# Patient Record
Sex: Female | Born: 1940 | Race: White | Hispanic: No | Marital: Married | Smoking: Former smoker
Health system: Southern US, Community
[De-identification: ages and names within clinical notes are randomized; demographics above are authoritative.]

## PROBLEM LIST (undated history)

## (undated) DIAGNOSIS — F32A Depression, unspecified: Secondary | ICD-10-CM

## (undated) DIAGNOSIS — F419 Anxiety disorder, unspecified: Secondary | ICD-10-CM

## (undated) DIAGNOSIS — K5190 Ulcerative colitis, unspecified, without complications: Secondary | ICD-10-CM

## (undated) DIAGNOSIS — E079 Disorder of thyroid, unspecified: Secondary | ICD-10-CM

## (undated) DIAGNOSIS — C4491 Basal cell carcinoma of skin, unspecified: Secondary | ICD-10-CM

## (undated) DIAGNOSIS — E785 Hyperlipidemia, unspecified: Secondary | ICD-10-CM

## (undated) DIAGNOSIS — K635 Polyp of colon: Secondary | ICD-10-CM

## (undated) DIAGNOSIS — D649 Anemia, unspecified: Secondary | ICD-10-CM

## (undated) DIAGNOSIS — K5790 Diverticulosis of intestine, part unspecified, without perforation or abscess without bleeding: Secondary | ICD-10-CM

## (undated) DIAGNOSIS — C439 Malignant melanoma of skin, unspecified: Secondary | ICD-10-CM

## (undated) DIAGNOSIS — K589 Irritable bowel syndrome without diarrhea: Secondary | ICD-10-CM

## (undated) DIAGNOSIS — M1990 Unspecified osteoarthritis, unspecified site: Secondary | ICD-10-CM

## (undated) DIAGNOSIS — G4730 Sleep apnea, unspecified: Secondary | ICD-10-CM

## (undated) DIAGNOSIS — R161 Splenomegaly, not elsewhere classified: Secondary | ICD-10-CM

## (undated) DIAGNOSIS — C9110 Chronic lymphocytic leukemia of B-cell type not having achieved remission: Secondary | ICD-10-CM

## (undated) DIAGNOSIS — J189 Pneumonia, unspecified organism: Secondary | ICD-10-CM

## (undated) HISTORY — DX: Depression, unspecified: F32.A

## (undated) HISTORY — DX: Anemia, unspecified: D64.9

## (undated) HISTORY — DX: Polyp of colon: K63.5

## (undated) HISTORY — DX: Sleep apnea, unspecified: G47.30

## (undated) HISTORY — DX: Disorder of thyroid, unspecified: E07.9

## (undated) HISTORY — DX: Pneumonia, unspecified organism: J18.9

## (undated) HISTORY — DX: Hyperlipidemia, unspecified: E78.5

## (undated) HISTORY — DX: Anxiety disorder, unspecified: F41.9

## (undated) HISTORY — DX: Basal cell carcinoma of skin, unspecified: C44.91

## (undated) HISTORY — DX: Malignant melanoma of skin, unspecified: C43.9

## (undated) HISTORY — DX: Diverticulosis of intestine, part unspecified, without perforation or abscess without bleeding: K57.90

## (undated) HISTORY — DX: Splenomegaly, not elsewhere classified: R16.1

## (undated) HISTORY — DX: Ulcerative colitis, unspecified, without complications: K51.90

## (undated) HISTORY — DX: Chronic lymphocytic leukemia of B-cell type not having achieved remission: C91.10

## (undated) HISTORY — DX: Irritable bowel syndrome, unspecified: K58.9

## (undated) HISTORY — DX: Unspecified osteoarthritis, unspecified site: M19.90

---

## 1973-01-13 HISTORY — PX: NASAL SINUS SURGERY: SHX719

## 1998-10-17 ENCOUNTER — Emergency Department (HOSPITAL_COMMUNITY): Admission: EM | Admit: 1998-10-17 | Discharge: 1998-10-17 | Payer: Self-pay | Admitting: Emergency Medicine

## 2002-12-24 ENCOUNTER — Emergency Department (HOSPITAL_COMMUNITY): Admission: EM | Admit: 2002-12-24 | Discharge: 2002-12-24 | Payer: Self-pay | Admitting: Emergency Medicine

## 2002-12-25 ENCOUNTER — Observation Stay (HOSPITAL_COMMUNITY): Admission: RE | Admit: 2002-12-25 | Discharge: 2002-12-25 | Payer: Self-pay | Admitting: Orthopedic Surgery

## 2003-01-19 ENCOUNTER — Encounter (HOSPITAL_COMMUNITY): Admission: RE | Admit: 2003-01-19 | Discharge: 2003-02-18 | Payer: Self-pay | Admitting: Orthopedic Surgery

## 2003-02-20 ENCOUNTER — Encounter (HOSPITAL_COMMUNITY): Admission: RE | Admit: 2003-02-20 | Discharge: 2003-03-22 | Payer: Self-pay | Admitting: Orthopedic Surgery

## 2003-03-23 ENCOUNTER — Encounter (HOSPITAL_COMMUNITY): Admission: RE | Admit: 2003-03-23 | Discharge: 2003-04-22 | Payer: Self-pay | Admitting: Orthopedic Surgery

## 2003-04-26 ENCOUNTER — Encounter (HOSPITAL_COMMUNITY): Admission: RE | Admit: 2003-04-26 | Discharge: 2003-05-26 | Payer: Self-pay | Admitting: Orthopedic Surgery

## 2005-08-13 ENCOUNTER — Encounter: Admission: RE | Admit: 2005-08-13 | Discharge: 2005-08-13 | Payer: Self-pay | Admitting: Family Medicine

## 2005-08-26 ENCOUNTER — Encounter: Admission: RE | Admit: 2005-08-26 | Discharge: 2005-08-26 | Payer: Self-pay | Admitting: Family Medicine

## 2005-09-02 ENCOUNTER — Ambulatory Visit (HOSPITAL_COMMUNITY): Admission: RE | Admit: 2005-09-02 | Discharge: 2005-09-02 | Payer: Self-pay | Admitting: Otolaryngology

## 2006-09-23 ENCOUNTER — Encounter: Admission: RE | Admit: 2006-09-23 | Discharge: 2006-09-23 | Payer: Self-pay | Admitting: Family Medicine

## 2007-11-02 ENCOUNTER — Encounter: Admission: RE | Admit: 2007-11-02 | Discharge: 2007-11-02 | Payer: Self-pay | Admitting: Family Medicine

## 2007-11-23 ENCOUNTER — Encounter: Admission: RE | Admit: 2007-11-23 | Discharge: 2007-11-23 | Payer: Self-pay | Admitting: Family Medicine

## 2009-04-20 ENCOUNTER — Encounter: Admission: RE | Admit: 2009-04-20 | Discharge: 2009-04-20 | Payer: Self-pay | Admitting: Family Medicine

## 2010-02-03 ENCOUNTER — Encounter: Payer: Self-pay | Admitting: Family Medicine

## 2010-02-04 ENCOUNTER — Encounter: Payer: Self-pay | Admitting: Family Medicine

## 2010-04-16 ENCOUNTER — Other Ambulatory Visit: Payer: Self-pay | Admitting: Family Medicine

## 2010-04-16 DIAGNOSIS — Z1231 Encounter for screening mammogram for malignant neoplasm of breast: Secondary | ICD-10-CM

## 2010-04-23 ENCOUNTER — Other Ambulatory Visit: Payer: Self-pay | Admitting: Family Medicine

## 2010-04-23 DIAGNOSIS — M8580 Other specified disorders of bone density and structure, unspecified site: Secondary | ICD-10-CM

## 2010-04-23 DIAGNOSIS — Z780 Asymptomatic menopausal state: Secondary | ICD-10-CM

## 2010-04-30 ENCOUNTER — Ambulatory Visit
Admission: RE | Admit: 2010-04-30 | Discharge: 2010-04-30 | Disposition: A | Discharge status: Home / Self Care | Payer: Medicare Other | Source: Ambulatory Visit | Attending: Family Medicine | Admitting: Family Medicine

## 2010-04-30 DIAGNOSIS — M8580 Other specified disorders of bone density and structure, unspecified site: Secondary | ICD-10-CM

## 2010-04-30 DIAGNOSIS — Z1231 Encounter for screening mammogram for malignant neoplasm of breast: Secondary | ICD-10-CM

## 2010-04-30 DIAGNOSIS — Z780 Asymptomatic menopausal state: Secondary | ICD-10-CM

## 2010-05-31 NOTE — Op Note (Signed)
NAME:  Belinda Montgomery, Belinda Montgomery NO.:  [id number]   MEDICAL RECORD NO.:  [id number]                   PATIENT TYPE:  OBV   LOCATION:  A309                                 FACILITY:  [facility]   PHYSICIAN:  Vickki Hearing, M.D.           DATE OF BIRTH:  February 13, 1940   DATE OF PROCEDURE:  12/25/2002  DATE OF DISCHARGE:                                 OPERATIVE REPORT   PREOPERATIVE DIAGNOSIS:  Fracture dislocation, right shoulder.   POSTOPERATIVE DIAGNOSIS:  Fracture dislocation, right shoulder.   PROCEDURE:  Closed reduction under anesthesia, right shoulder fracture  dislocation.   SURGEON:  Vickki Hearing, M.D.   ANESTHETIC:  IV sedation.   OPERATIVE FINDINGS:  Greater tuberosity fracture, anterior-inferior  dislocation of the right glenohumeral joint.  After reduction using  countertraction and traction, radiograph in the OR and fluoroscopic  radiographs confirmed reduction.   PROCEDURE IN DETAIL:  The patient was identified as Belinda Montgomery.  She  was taken to the operating room where a general anesthetic was administered.  We took time out and checked her chart and consent and armband and confirmed  the procedure.   PROCEDURE:  With traction/countertraction method and reduced the shoulder.  Took a C-arm x-ray and fluoroscopic views to confirm the reduction, reversed  the anesthesia, took her back to the room for recovery.   She will follow up on the 20th for evaluation.  She should be in a shoulder  sling and swathe.  Percocet, Norflex, and ibuprofen for pain, muscle  relaxation, and swelling.      ___________________________________________                                            Vickki Hearing, M.D.   SEH/MEDQ  D:  12/25/2002  T:  12/25/2002  Job:  [id number]

## 2011-01-20 DIAGNOSIS — Z85828 Personal history of other malignant neoplasm of skin: Secondary | ICD-10-CM | POA: Insufficient documentation

## 2011-06-30 ENCOUNTER — Other Ambulatory Visit (HOSPITAL_COMMUNITY): Payer: Self-pay | Admitting: Family Medicine

## 2011-06-30 DIAGNOSIS — Z139 Encounter for screening, unspecified: Secondary | ICD-10-CM

## 2011-07-21 ENCOUNTER — Ambulatory Visit (HOSPITAL_COMMUNITY)
Admission: RE | Admit: 2011-07-21 | Discharge: 2011-07-21 | Disposition: A | Discharge status: Home / Self Care | Payer: Medicare Other | Source: Ambulatory Visit | Attending: Family Medicine | Admitting: Family Medicine

## 2011-07-21 DIAGNOSIS — Z1231 Encounter for screening mammogram for malignant neoplasm of breast: Secondary | ICD-10-CM | POA: Insufficient documentation

## 2011-07-21 DIAGNOSIS — Z139 Encounter for screening, unspecified: Secondary | ICD-10-CM

## 2012-06-30 ENCOUNTER — Other Ambulatory Visit: Payer: Self-pay | Admitting: Family Medicine

## 2012-07-01 ENCOUNTER — Other Ambulatory Visit: Payer: Self-pay | Admitting: Family Medicine

## 2012-07-01 DIAGNOSIS — Z1231 Encounter for screening mammogram for malignant neoplasm of breast: Secondary | ICD-10-CM

## 2012-07-01 DIAGNOSIS — M8580 Other specified disorders of bone density and structure, unspecified site: Secondary | ICD-10-CM

## 2012-07-15 DIAGNOSIS — D72820 Lymphocytosis (symptomatic): Secondary | ICD-10-CM | POA: Insufficient documentation

## 2012-07-21 ENCOUNTER — Other Ambulatory Visit: Payer: Medicare Other

## 2012-07-21 ENCOUNTER — Ambulatory Visit: Payer: Medicare Other

## 2012-08-03 ENCOUNTER — Encounter: Payer: Self-pay | Admitting: Gastroenterology

## 2012-08-11 ENCOUNTER — Ambulatory Visit
Admission: RE | Admit: 2012-08-11 | Discharge: 2012-08-11 | Disposition: A | Discharge status: Home / Self Care | Payer: Medicare Other | Source: Ambulatory Visit | Attending: Family Medicine | Admitting: Family Medicine

## 2012-08-11 DIAGNOSIS — Z1231 Encounter for screening mammogram for malignant neoplasm of breast: Secondary | ICD-10-CM

## 2012-08-11 DIAGNOSIS — M8580 Other specified disorders of bone density and structure, unspecified site: Secondary | ICD-10-CM

## 2013-07-06 ENCOUNTER — Other Ambulatory Visit: Payer: Self-pay

## 2013-07-06 DIAGNOSIS — Z1231 Encounter for screening mammogram for malignant neoplasm of breast: Secondary | ICD-10-CM

## 2013-08-15 ENCOUNTER — Ambulatory Visit
Admission: RE | Admit: 2013-08-15 | Discharge: 2013-08-15 | Disposition: A | Discharge status: Home / Self Care | Payer: Medicare Other | Source: Ambulatory Visit

## 2013-08-15 DIAGNOSIS — Z1231 Encounter for screening mammogram for malignant neoplasm of breast: Secondary | ICD-10-CM

## 2014-01-13 HISTORY — PX: LUMBAR LAMINECTOMY: SHX95

## 2014-03-06 DIAGNOSIS — Z85820 Personal history of malignant melanoma of skin: Secondary | ICD-10-CM | POA: Insufficient documentation

## 2014-03-28 DIAGNOSIS — M5126 Other intervertebral disc displacement, lumbar region: Secondary | ICD-10-CM | POA: Insufficient documentation

## 2015-04-25 ENCOUNTER — Other Ambulatory Visit: Payer: Self-pay | Admitting: Internal Medicine

## 2015-04-25 DIAGNOSIS — M8580 Other specified disorders of bone density and structure, unspecified site: Secondary | ICD-10-CM

## 2015-04-26 ENCOUNTER — Other Ambulatory Visit: Payer: Self-pay

## 2015-04-26 DIAGNOSIS — Z1231 Encounter for screening mammogram for malignant neoplasm of breast: Secondary | ICD-10-CM

## 2015-05-25 ENCOUNTER — Ambulatory Visit
Admission: RE | Admit: 2015-05-25 | Discharge: 2015-05-25 | Disposition: A | Discharge status: Home / Self Care | Payer: Medicare Other | Source: Ambulatory Visit

## 2015-05-25 ENCOUNTER — Ambulatory Visit
Admission: RE | Admit: 2015-05-25 | Discharge: 2015-05-25 | Disposition: A | Discharge status: Home / Self Care | Payer: Medicare Other | Source: Ambulatory Visit | Attending: Internal Medicine | Admitting: Internal Medicine

## 2015-05-25 DIAGNOSIS — M8580 Other specified disorders of bone density and structure, unspecified site: Secondary | ICD-10-CM

## 2015-05-25 DIAGNOSIS — Z1231 Encounter for screening mammogram for malignant neoplasm of breast: Secondary | ICD-10-CM

## 2016-02-14 DIAGNOSIS — M533 Sacrococcygeal disorders, not elsewhere classified: Secondary | ICD-10-CM | POA: Insufficient documentation

## 2016-07-29 DIAGNOSIS — E039 Hypothyroidism, unspecified: Secondary | ICD-10-CM | POA: Insufficient documentation

## 2016-12-09 NOTE — Progress Notes (Signed)
[name] [address], [place] [address] Phone: [phone] Subjective:    I'm seeing this patient by the request  of:    CC: right knee pain   [id number]  Belinda Montgomery is a 76 y.o. female coming in with complaint of right knee pain. She states that she gets swelling in the inside of her knee.  She has osteoarthritis (hips and lower back). She's had a L3/L4 disc surgery almost 2 years ago.  Onset- Chronic Location- medial and patella  Duration- Throughout the day and during the night Character- Sharp. Mostly "level 3 pain" Aggravating factors- Stairs, kneeling  Reliving factors- topical  Therapies tried-  Severity-6 out of 10      History reviewed. No pertinent past medical history. History reviewed. No pertinent surgical history. Social History   Socioeconomic History  . Marital status: Divorced    Spouse name: None  . Number of children: None  . Years of education: None  . Highest education level: None  Social Needs  . Financial resource strain: None  . Food insecurity - worry: None  . Food insecurity - inability: None  . Transportation needs - medical: None  . Transportation needs - non-medical: None  Occupational History  . None  Tobacco Use  . Smoking status: Former Research scientist (life sciences)  . Smokeless tobacco: Never Used  Substance and Sexual Activity  . Alcohol use: None  . Drug use: None  . Sexual activity: None  Other Topics Concern  . None  Social History Narrative  . None   No Known Allergies History reviewed. No pertinent family history.  No family history of autoimmune disease   Past medical history, social, surgical and family history all reviewed in electronic medical record.  No pertanent information unless stated regarding to the chief complaint.   Review of Systems:Review of systems updated and as accurate as of 12/10/16  No headache, visual changes, nausea, vomiting, diarrhea, constipation, dizziness,  abdominal pain, skin rash, fevers, chills, night sweats, weight loss, swollen lymph nodes, body aches, joint swelling, muscle aches, chest pain, shortness of breath, mood changes.   Objective  Blood pressure (!) 144/70, pulse 70, height 5\' 6"  (1.676 m), weight 158 lb (71.7 kg), SpO2 98 %. Systems examined below as of 12/10/16   General: No apparent distress alert and oriented x3 mood and affect normal, dressed appropriately.  HEENT: Pupils equal, extraocular movements intact  Respiratory: Patient's speak in full sentences and does not appear short of breath  Cardiovascular: No lower extremity edema, non tender, no erythema  Skin: Warm dry intact with no signs of infection or rash on extremities or on axial skeleton.  Abdomen: Soft nontender  Neuro: Cranial nerves II through XII are intact, neurovascularly intact in all extremities with 2+ DTRs and 2+ pulses.  Lymph: No lymphadenopathy of posterior or anterior cervical chain or axillae bilaterally.  Gait normal with good balance and coordination.  MSK:  Non tender with full range of motion and good stability and symmetric strength and tone of shoulders, elbows, wrist, hip, and ankles bilaterally.  Mild arthritic changes of multiple joints  Right knee exam shows the patient does have some mild osteoarthritic changes.  Ligaments seem to be stable.  Mild crepitus with range of motion.  Patient does have a positive McMurray's.  Pain over the medial joint line.  Limited muscular skeletal ultrasound was performed and interpreted by [name]  Limited musculoskeletal ultrasound showed the patient does have a  degenerative meniscal tear with very mild displacement on the medial posterior aspect. Impression: Degenerative meniscal tear  97110; 15 additional minutes spent for Therapeutic exercises as stated in above notes.  This included exercises focusing on stretching, strengthening, with significant focus on eccentric aspects.   Long term goals  include an improvement in range of motion, strength, endurance as well as avoiding reinjury. Patient's frequency would include in 1-2 times a day, 3-5 times a week for a duration of 6-12 weeks.   Flexion-extension exercises working on eccentric to the hamstring and quadricep.  Proper technique shown and discussed handout in great detail with ATC.  All questions were discussed and answered.     Impression and Recommendations:     This case required medical decision making of moderate complexity.      Note: This dictation was prepared with Dragon dictation along with smaller phrase technology. Any transcriptional errors that result from this process are unintentional.

## 2016-12-10 ENCOUNTER — Encounter: Payer: Self-pay | Admitting: Family Medicine

## 2016-12-10 ENCOUNTER — Ambulatory Visit: Payer: Self-pay

## 2016-12-10 ENCOUNTER — Ambulatory Visit (INDEPENDENT_AMBULATORY_CARE_PROVIDER_SITE_OTHER): Payer: Medicare Other | Admitting: Family Medicine

## 2016-12-10 VITALS — BP 144/70 | HR 70 | Ht 66.0 in | Wt 158.0 lb

## 2016-12-10 DIAGNOSIS — S83221A Peripheral tear of medial meniscus, current injury, right knee, initial encounter: Secondary | ICD-10-CM | POA: Diagnosis not present

## 2016-12-10 DIAGNOSIS — G8929 Other chronic pain: Secondary | ICD-10-CM

## 2016-12-10 DIAGNOSIS — S83206A Unspecified tear of unspecified meniscus, current injury, right knee, initial encounter: Secondary | ICD-10-CM | POA: Insufficient documentation

## 2016-12-10 DIAGNOSIS — M25561 Pain in right knee: Secondary | ICD-10-CM | POA: Diagnosis not present

## 2016-12-10 MED ORDER — VITAMIN D (ERGOCALCIFEROL) 1.25 MG (50000 UNIT) PO CAPS: 50000.0000 [IU] | ORAL_CAPSULE | ORAL | 0 refills | Status: DC

## 2016-12-10 NOTE — Assessment & Plan Note (Signed)
Patient does have a meniscal tear that seems to be degenerative.  We discussed with patient at great length.  Patient will try once weekly vitamin D for muscle strength and endurance, home exercise, icing regimen, topical anti-inflammatories.  Work with Product/process development scientist to learn home exercise greater detail.  Follow-up again in 4 weeks if no better consider formal physical therapy and injection.

## 2016-12-10 NOTE — Patient Instructions (Addendum)
Nice to meet you  Ice 20 minutes 2 times daily. Usually after activity and before bed. Exercises 3 times a week.  pennsaid pinkie amount topically 2 times daily as needed.  Once weekly vitamin D for 12 weeks  Avoid squatting and twisting.  Turmeric 500mg  daily  Possible a squatting stool for gardening.  See me again in 4 weeks

## 2017-01-07 NOTE — Progress Notes (Signed)
[name] [address], [place] [address] Phone: [phone] Subjective:    I'm seeing this patient by the request  of:    CC: Follow-up  [id number]  Belinda Montgomery is a 76 y.o. female coming in with complaint of right knee pain.  Found to have a meniscal tear with some mild osteoarthritic changes.  Given home exercises, icing regimen, we discussed over-the-counter braces and given trial of topical anti-inflammatories.  Patient states no improvement.  Has been somewhat noncompliant with the conservative therapy.     No past medical history on file. No past surgical history on file. Social History   Socioeconomic History  . Marital status: Divorced    Spouse name: Not on file  . Number of children: Not on file  . Years of education: Not on file  . Highest education level: Not on file  Social Needs  . Financial resource strain: Not on file  . Food insecurity - worry: Not on file  . Food insecurity - inability: Not on file  . Transportation needs - medical: Not on file  . Transportation needs - non-medical: Not on file  Occupational History  . Not on file  Tobacco Use  . Smoking status: Former Research scientist (life sciences)  . Smokeless tobacco: Never Used  Substance and Sexual Activity  . Alcohol use: Not on file  . Drug use: Not on file  . Sexual activity: Not on file  Other Topics Concern  . Not on file  Social History Narrative  . Not on file   No Known Allergies No family history on file.   Past medical history, social, surgical and family history all reviewed in electronic medical record.  No pertanent information unless stated regarding to the chief complaint.   Review of Systems:Review of systems updated and as accurate as of 01/07/17  No headache, visual changes, nausea, vomiting, diarrhea, constipation, dizziness, abdominal pain, skin rash, fevers, chills, night sweats, weight loss, swollen lymph nodes, body aches, joint swelling,  chest pain, shortness of breath, mood changes.  Positive muscle aches  Objective  There were no vitals taken for this visit. Systems examined below as of 01/07/17   General: No apparent distress alert and oriented x3 mood and affect normal, dressed appropriately.  HEENT: Pupils equal, extraocular movements intact  Respiratory: Patient's speak in full sentences and does not appear short of breath  Cardiovascular: No lower extremity edema, non tender, no erythema  Skin: Warm dry intact with no signs of infection or rash on extremities or on axial skeleton.  Abdomen: Soft nontender  Neuro: Cranial nerves II through XII are intact, neurovascularly intact in all extremities with 2+ DTRs and 2+ pulses.  Lymph: No lymphadenopathy of posterior or anterior cervical chain or axillae bilaterally.  Gait normal with good balance and coordination.  MSK:  Non tender with full range of motion and good stability and symmetric strength and tone of shoulders, elbows, wrist, hip and ankles bilaterally.   Knee: Right Normal to inspection with no erythema or effusion or obvious bony abnormalities. Palpation over the medial joint line ROM full in flexion and extension and lower leg rotation. Ligaments with solid consistent endpoints including ACL, PCL, LCL, MCL. Positive Mcmurray's, Apley's, and Thessalonian tests. Non painful patellar compression. Patellar glide with moderate crepitus. Patellar and quadriceps tendons unremarkable. Hamstring and quadriceps strength is normal. Contralateral knee unremarkable  After informed written and verbal consent, patient was seated on exam table. Right knee was prepped with  alcohol swab and utilizing anterolateral approach, patient's right knee space was injected with 4:1  marcaine 0.5%: Kenalog 40mg /dL. Patient tolerated the procedure well without immediate complications.   Impression and Recommendations:     This case required medical decision making of moderate  complexity.      Note: This dictation was prepared with Dragon dictation along with smaller phrase technology. Any transcriptional errors that result from this process are unintentional.

## 2017-01-08 ENCOUNTER — Encounter: Payer: Self-pay | Admitting: Family Medicine

## 2017-01-08 ENCOUNTER — Ambulatory Visit (INDEPENDENT_AMBULATORY_CARE_PROVIDER_SITE_OTHER): Payer: Medicare Other | Admitting: Family Medicine

## 2017-01-08 VITALS — BP 120/62 | HR 75 | Ht 66.0 in | Wt 162.0 lb

## 2017-01-08 DIAGNOSIS — M25561 Pain in right knee: Secondary | ICD-10-CM

## 2017-01-08 DIAGNOSIS — S83221D Peripheral tear of medial meniscus, current injury, right knee, subsequent encounter: Secondary | ICD-10-CM

## 2017-01-08 NOTE — Assessment & Plan Note (Signed)
Worsening symptoms.  Given injection today.  Sent to formal physical therapy.  Continue to once weekly vitamin D, icing regimen.  May need further imaging if this continues.  Patient wants to avoid any surgical intervention at this time so I do not feel that this will change management.

## 2017-01-08 NOTE — Patient Instructions (Signed)
Good to see you  Belinda Montgomery is your friend.  Injected the knee today to calm it down as well.  Physical therapy will be calling you  Continue my exercises 2-3 times a week  See me again in 4 weeks

## 2017-01-27 ENCOUNTER — Telehealth: Payer: Self-pay | Admitting: Family Medicine

## 2017-01-27 NOTE — Telephone Encounter (Signed)
Copied from [place]. Topic: Quick Communication - See Telephone Encounter >> Jan 27, 2017  4:39 PM Bea Graff, NT wrote: CRM for notification. See Telephone encounter for: Pt would like a call from Dr. Tamala Julian regarding her right knee pain. She thinks she may have re-injured her knee.  01/27/17.

## 2017-01-30 ENCOUNTER — Ambulatory Visit (INDEPENDENT_AMBULATORY_CARE_PROVIDER_SITE_OTHER): Payer: Medicare Other | Admitting: Family Medicine

## 2017-01-30 ENCOUNTER — Encounter: Payer: Self-pay | Admitting: Family Medicine

## 2017-01-30 VITALS — BP 140/70 | HR 68 | Ht 66.0 in | Wt 162.0 lb

## 2017-01-30 DIAGNOSIS — M25561 Pain in right knee: Secondary | ICD-10-CM

## 2017-01-30 DIAGNOSIS — G8929 Other chronic pain: Secondary | ICD-10-CM

## 2017-01-30 DIAGNOSIS — S83221D Peripheral tear of medial meniscus, current injury, right knee, subsequent encounter: Secondary | ICD-10-CM

## 2017-01-30 MED ORDER — PREDNISONE 50 MG PO TABS: 50.0000 mg | ORAL_TABLET | Freq: Every day | ORAL | 0 refills | Status: DC

## 2017-01-30 NOTE — Assessment & Plan Note (Signed)
Worsening symptoms.  Patient had a meniscal tear and seemed to have increasing discomfort.  Patient is having even difficulty with weightbearing at this time.  Has failed all other conservative therapy.  Patient will have an MRI to discuss the possibility of either Visco supplementation for underlying arthritis or potentially surgical intervention.  On MRI we will discuss further treatment options.

## 2017-01-30 NOTE — Patient Instructions (Signed)
Good to see you  Belinda Montgomery is your friend.  Possible compression sleeve to knee as well  We will get MRI  Depending on this we may need surgery and will refer accordingly Call [phone] to schedule the MRI  We will discuss after the scan

## 2017-01-30 NOTE — Progress Notes (Signed)
  [name] [address] [place], [place] [address] Phone: [phone] Subjective:     CC: Right knee pain  [id number]  Belinda Montgomery is a 77 y.o. female coming in with complaint of right knee pain. [facility] would not take insurance. She was cleaning when she twisted her knee.  Since then she feels like there is more swelling.  Locking.  Having very significant difficulty even with weight bearing.      No past medical history on file. No past surgical history on file. Social History   Socioeconomic History  . Marital status: Divorced    Spouse name: None  . Number of children: None  . Years of education: None  . Highest education level: None  Social Needs  . Financial resource strain: None  . Food insecurity - worry: None  . Food insecurity - inability: None  . Transportation needs - medical: None  . Transportation needs - non-medical: None  Occupational History  . None  Tobacco Use  . Smoking status: Former Research scientist (life sciences)  . Smokeless tobacco: Never Used  Substance and Sexual Activity  . Alcohol use: None  . Drug use: None  . Sexual activity: None  Other Topics Concern  . None  Social History Narrative  . None   No Known Allergies No family history on file.   Past medical history, social, surgical and family history all reviewed in electronic medical record.  No pertanent information unless stated regarding to the chief complaint.   Review of Systems:Review of systems updated and as accurate as of 01/30/17  No headache, visual changes, nausea, vomiting, diarrhea, constipation, dizziness, abdominal pain, skin rash, fevers, chills, night sweats, weight loss, swollen lymph nodes, body aches chest pain, shortness of breath, mood changes.  Muscle aches and joint effusion  Objective  Blood pressure 140/70, pulse 68, height 5\' 6"  (1.676 m), weight 162 lb (73.5 kg), SpO2 97 %. Systems examined below as of 01/30/17   General: No  apparent distress alert and oriented x3 mood and affect normal, dressed appropriately.  HEENT: Pupils equal, extraocular movements intact  Respiratory: Patient's speak in full sentences and does not appear short of breath  Cardiovascular: No lower extremity edema, non tender, no erythema  Skin: Warm dry intact with no signs of infection or rash on extremities or on axial skeleton.  Abdomen: Soft nontender  Neuro: Cranial nerves II through XII are intact, neurovascularly intact in all extremities with 2+ DTRs and 2+ pulses.  Lymph: No lymphadenopathy of posterior or anterior cervical chain or axillae bilaterally.  Gait antalgic MSK:  Non tender with full range of motion and good stability and symmetric strength and tone of shoulders, elbows, wrist, hip, and ankles bilaterally.  Right knee exam shows that there is an effusion.  Left degrees of flexion.  Positive McMurray's.  Crepitus noted.  Severe tenderness to palpation over the medial joint line.   Impression and Recommendations:     This case required medical decision making of moderate complexity.      Note: This dictation was prepared with Dragon dictation along with smaller phrase technology. Any transcriptional errors that result from this process are unintentional.

## 2017-01-31 ENCOUNTER — Ambulatory Visit
Admission: RE | Admit: 2017-01-31 | Discharge: 2017-01-31 | Disposition: A | Discharge status: Home / Self Care | Payer: Medicare Other | Source: Ambulatory Visit | Attending: Family Medicine | Admitting: Family Medicine

## 2017-01-31 DIAGNOSIS — M25561 Pain in right knee: Principal | ICD-10-CM

## 2017-01-31 DIAGNOSIS — G8929 Other chronic pain: Secondary | ICD-10-CM

## 2017-02-02 ENCOUNTER — Telehealth: Payer: Self-pay | Admitting: General Practice

## 2017-02-02 NOTE — Progress Notes (Signed)
Spoke with patient per results. She would like to come in and speak with Dr. Tamala Julian about options.

## 2017-02-02 NOTE — Telephone Encounter (Signed)
The patient called stating she was returning a phone call from [place]. The patient states she has Medicare and Medicaid, and has some questions concerning them.   The patient would like to know what combinations of medicare and medicare work for our office. I advised the patient on what I knew: that medicaid is only accepted when the patient has already been established in our office, for family planning purposes, or for medicaid as a secondary insurance.  The patient would like to have "outpatient therapy" explained to her. She would like to know what it is, where to go to set it up, and how insurance would work for it.   The patient would like to know, if she's seen in our office, what her copay would be if we just billed her medicare.   Please call patient back to advise.

## 2017-02-03 NOTE — Telephone Encounter (Signed)
I have called the patient and spoke to her about our office not being able to accept Medicaid as a form of insurance primarily or secondary. I suggested that the patient contact the referring provider and they can discuss other potential offices that do accept her insurance. Patient stated that she understood and will reach out to the referring provider. No further action required by our office.

## 2017-02-04 NOTE — Progress Notes (Deleted)
  [name] [address] [place], [place] [address] Phone: [phone] Subjective:    I'm seeing this patient by the request  of:    CC: Knee pain follow-up  [id number]  Belinda Montgomery is a 77 y.o. female coming in with complaint of knee pain.  Patient was having worsening symptoms.  Patient was sent for an MRI.  MRI was independently visualized showing the patient did have a degenerative tear posterior horn of the medial meniscus.  This is what we saw previously.  There was a flap tear noted that likely contributed to the locking the patient was having.  Patient was given the choices of formal physical therapy or possibly surgical intervention.  Patient states     No past medical history on file. No past surgical history on file. Social History   Socioeconomic History  . Marital status: Divorced    Spouse name: Not on file  . Number of children: Not on file  . Years of education: Not on file  . Highest education level: Not on file  Social Needs  . Financial resource strain: Not on file  . Food insecurity - worry: Not on file  . Food insecurity - inability: Not on file  . Transportation needs - medical: Not on file  . Transportation needs - non-medical: Not on file  Occupational History  . Not on file  Tobacco Use  . Smoking status: Former Research scientist (life sciences)  . Smokeless tobacco: Never Used  Substance and Sexual Activity  . Alcohol use: Not on file  . Drug use: Not on file  . Sexual activity: Not on file  Other Topics Concern  . Not on file  Social History Narrative  . Not on file   No Known Allergies No family history on file.   Past medical history, social, surgical and family history all reviewed in electronic medical record.  No pertanent information unless stated regarding to the chief complaint.   Review of Systems:Review of systems updated and as accurate as of 02/04/17  No headache, visual changes, nausea, vomiting, diarrhea,  constipation, dizziness, abdominal pain, skin rash, fevers, chills, night sweats, weight loss, swollen lymph nodes, body aches, joint swelling, muscle aches, chest pain, shortness of breath, mood changes.   Objective  There were no vitals taken for this visit. Systems examined below as of 02/04/17   General: No apparent distress alert and oriented x3 mood and affect normal, dressed appropriately.  HEENT: Pupils equal, extraocular movements intact  Respiratory: Patient's speak in full sentences and does not appear short of breath  Cardiovascular: No lower extremity edema, non tender, no erythema  Skin: Warm dry intact with no signs of infection or rash on extremities or on axial skeleton.  Abdomen: Soft nontender  Neuro: Cranial nerves II through XII are intact, neurovascularly intact in all extremities with 2+ DTRs and 2+ pulses.  Lymph: No lymphadenopathy of posterior or anterior cervical chain or axillae bilaterally.  Gait normal with good balance and coordination.  MSK:  Non tender with full range of motion and good stability and symmetric strength and tone of shoulders, elbows, wrist, hip, knee and ankles bilaterally.     Impression and Recommendations:     This case required medical decision making of moderate complexity.      Note: This dictation was prepared with Dragon dictation along with smaller phrase technology. Any transcriptional errors that result from this process are unintentional.

## 2017-02-05 ENCOUNTER — Ambulatory Visit: Payer: Medicare Other | Admitting: Family Medicine

## 2017-02-15 NOTE — Progress Notes (Signed)
[name] [place], [place] [address] Phone: [phone] Subjective:    I'm seeing this patient by the request  of:    CC: Right knee pain follow-up  UMP:[id number]  Belinda Montgomery is a 77 y.o. female coming in with complaint of right knee pain.  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  Was not improving with conservative therapy.  Unable to go to formal physical therapy due to insurance difficulties. Patient's MRI did confirm the degenerative tear of the posterior horn of the medial meniscus.  This was independently visualized by me.  Mild arthritic changes otherwise.  Patient is having locking.  Patient states that it feels like it is giving out.  Been using a cane.  Patient feels very unsteady on the knee.  Cannot flex it or extend it all the way.  Still having severe pain.  Failed all conservative therapy.      No past medical history on file. No past surgical history on file. Social History   Socioeconomic History  . Marital status: Divorced    Spouse name: None  . Number of children: None  . Years of education: None  . Highest education level: None  Social Needs  . Financial resource strain: None  . Food insecurity - worry: None  . Food insecurity - inability: None  . Transportation needs - medical: None  . Transportation needs - non-medical: None  Occupational History  . None  Tobacco Use  . Smoking status: Former Research scientist (life sciences)  . Smokeless tobacco: Never Used  Substance and Sexual Activity  . Alcohol use: None  . Drug use: None  . Sexual activity: None  Other Topics Concern  . None  Social History Narrative  . None   No Known Allergies No family history on file.   Past medical history, social, surgical and family history all reviewed in electronic medical record.  No pertanent information unless stated regarding to the chief complaint.   Review of Systems:Review of systems updated and as  accurate as of 02/16/17  No headache, visual changes, nausea, vomiting, diarrhea, constipation, dizziness, abdominal pain, skin rash, fevers, chills, night sweats, weight loss, swollen lymph nodes, body aches, joint swelling, muscle aches, chest pain, shortness of breath, mood changes.   Objective  Blood pressure (!) 142/64, pulse 80, height 5\' 6"  (1.676 m), weight 162 lb (73.5 kg), SpO2 97 %. Systems examined below as of 02/16/17   General: No apparent distress alert and oriented x3 mood and affect normal, dressed appropriately.  HEENT: Pupils equal, extraocular movements intact  Respiratory: Patient's speak in full sentences and does not appear short of breath  Cardiovascular: No lower extremity edema, non tender, no erythema  Skin: Warm dry intact with no signs of infection or rash on extremities or on axial skeleton.  Abdomen: Soft nontender  Neuro: Cranial nerves II through XII are intact, neurovascularly intact in all extremities with 2+ DTRs and 2+ pulses.  Lymph: No lymphadenopathy of posterior or anterior cervical chain or axillae bilaterally.  Gait severely antalgic MSK:  Non tender with full range of motion and good stability and symmetric strength and tone of shoulders, elbows, wrist, hip and ankles bilaterally.  Knee: Right knee Normal to inspection with no erythema or effusion or obvious bony abnormalities. Tender over the medial joint line ROM lacks full extension by 5 degrees and lacks full flexion by 10 degrees Ligaments with solid consistent endpoints including ACL, PCL, LCL, MCL.  Positive Mcmurray's, Apley's, and Thessalonian tests. painful patellar compression. Patellar glide moderate crepitus. Patellar and quadriceps tendons unremarkable. Hamstring and quadriceps strength is normal. Contralateral knee unremarkable      Impression and Recommendations:     This case required medical decision making of moderate complexity.      Note: This dictation was  prepared with Dragon dictation along with smaller phrase technology. Any transcriptional errors that result from this process are unintentional.

## 2017-02-16 ENCOUNTER — Encounter: Payer: Self-pay | Admitting: Family Medicine

## 2017-02-16 ENCOUNTER — Ambulatory Visit (INDEPENDENT_AMBULATORY_CARE_PROVIDER_SITE_OTHER): Payer: Medicare Other | Admitting: Family Medicine

## 2017-02-16 VITALS — BP 142/64 | HR 80 | Ht 66.0 in | Wt 162.0 lb

## 2017-02-16 DIAGNOSIS — S83221D Peripheral tear of medial meniscus, current injury, right knee, subsequent encounter: Secondary | ICD-10-CM | POA: Diagnosis not present

## 2017-02-16 DIAGNOSIS — M25561 Pain in right knee: Secondary | ICD-10-CM | POA: Diagnosis not present

## 2017-02-16 NOTE — Assessment & Plan Note (Signed)
Spent  25 minutes with patient face-to-face and had greater than 50% of counseling including as described in assessment and plan.  Discussed with patient likely she does have a degenerative tear.  Patient has failed all conservative therapy with a locking aspect of patient would like some type of potential surgical interventions the patient would be able to get back to her work as well.  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  He is even having difficulty with activities of daily living.  Will be referred to the orthopedic surgeon of her choice at this point.  Had questions about billing aspect for the surgery and discussed that this would be better asked and answered to the orthopedic surgeon.

## 2017-02-16 NOTE — Patient Instructions (Signed)
Good to see you  Belinda Montgomery is your friend.  We will get you to Dr. Hassell Done at [facility] and see what we can do about the meniscus.  Sorry for the round about way here to get it done.

## 2017-03-05 ENCOUNTER — Encounter: Payer: Self-pay | Admitting: Family Medicine

## 2017-05-18 ENCOUNTER — Other Ambulatory Visit: Payer: Self-pay | Admitting: Internal Medicine

## 2017-05-18 DIAGNOSIS — Z1231 Encounter for screening mammogram for malignant neoplasm of breast: Secondary | ICD-10-CM

## 2017-05-18 DIAGNOSIS — E2839 Other primary ovarian failure: Secondary | ICD-10-CM

## 2017-06-26 ENCOUNTER — Ambulatory Visit: Payer: Medicare Other

## 2017-06-26 ENCOUNTER — Other Ambulatory Visit: Payer: Medicare Other

## 2017-08-05 ENCOUNTER — Ambulatory Visit
Admission: RE | Admit: 2017-08-05 | Discharge: 2017-08-05 | Disposition: A | Discharge status: Home / Self Care | Payer: Medicare Other | Source: Ambulatory Visit | Attending: Internal Medicine | Admitting: Internal Medicine

## 2017-08-05 DIAGNOSIS — E2839 Other primary ovarian failure: Secondary | ICD-10-CM

## 2017-08-05 DIAGNOSIS — Z1231 Encounter for screening mammogram for malignant neoplasm of breast: Secondary | ICD-10-CM

## 2018-02-05 ENCOUNTER — Other Ambulatory Visit: Payer: Self-pay | Admitting: Orthopedic Surgery

## 2018-02-05 DIAGNOSIS — M79661 Pain in right lower leg: Secondary | ICD-10-CM

## 2018-02-05 DIAGNOSIS — R609 Edema, unspecified: Secondary | ICD-10-CM

## 2018-02-08 ENCOUNTER — Ambulatory Visit
Admission: RE | Admit: 2018-02-08 | Discharge: 2018-02-08 | Disposition: A | Discharge status: Home / Self Care | Payer: Medicare Other | Source: Ambulatory Visit | Attending: Orthopedic Surgery | Admitting: Orthopedic Surgery

## 2018-02-08 DIAGNOSIS — M79661 Pain in right lower leg: Secondary | ICD-10-CM

## 2018-02-08 DIAGNOSIS — R609 Edema, unspecified: Secondary | ICD-10-CM

## 2018-07-08 DIAGNOSIS — E7800 Pure hypercholesterolemia, unspecified: Secondary | ICD-10-CM | POA: Insufficient documentation

## 2018-08-03 DIAGNOSIS — D509 Iron deficiency anemia, unspecified: Secondary | ICD-10-CM | POA: Insufficient documentation

## 2018-09-07 LAB — HM COLONOSCOPY

## 2018-12-07 DIAGNOSIS — C9110 Chronic lymphocytic leukemia of B-cell type not having achieved remission: Secondary | ICD-10-CM | POA: Insufficient documentation

## 2019-01-14 HISTORY — PX: CHOLECYSTECTOMY: SHX55

## 2019-01-19 IMAGING — MR MR KNEE*R* W/O CM
5 of 6 series · 33 of 40 positions shown · non-contrast
Comparison: None.

CLINICAL DATA: Progressive medial knee pain for several years. No
acute injury or prior relevant surgery.

EXAM:
MRI OF THE RIGHT KNEE WITHOUT CONTRAST
TECHNIQUE: Multiplanar, multisequence MR imaging of the knee was performed. No
intravenous contrast was administered.

[Series 6: PD fat-sat · axial · right · 3.0mm · 0.39mm/px · z∈[-49,+65]mm · 8 of 36 slices shown (1 of 3)]
[im 1/36]
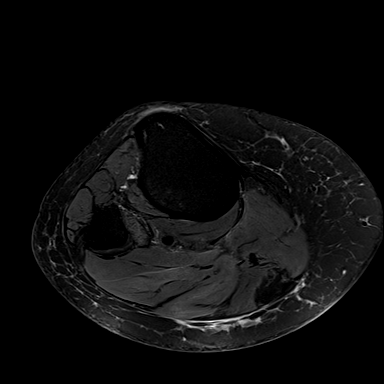
[im 6/36]
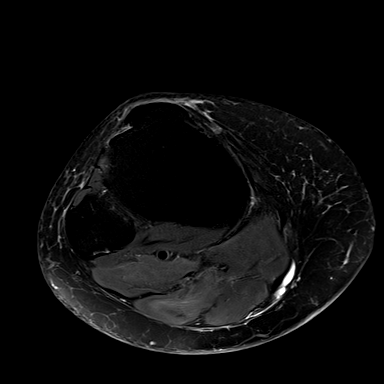
[im 11/36]
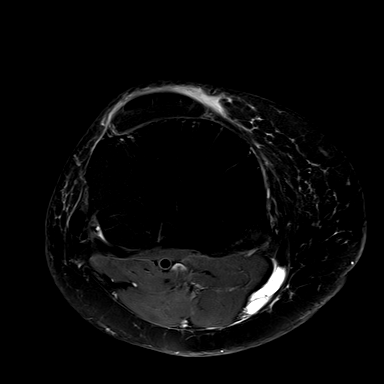
[im 16/36]
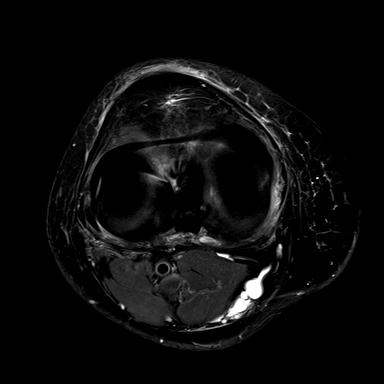
[im 21/36]
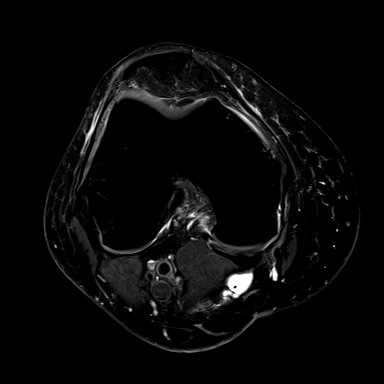
[im 26/36]
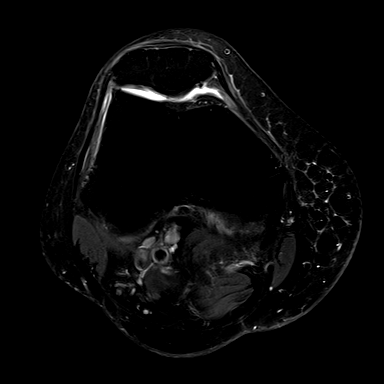
[im 31/36]
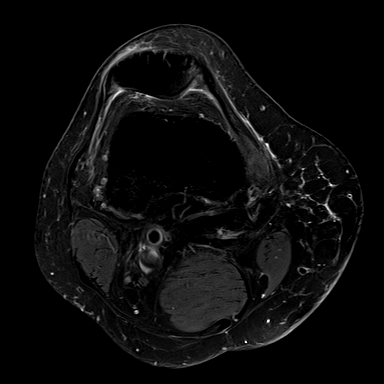
[im 36/36]
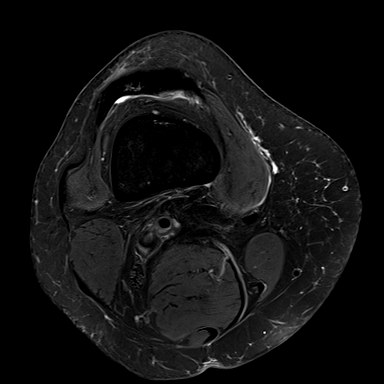

[Series 8: PD fat-sat · coronal · right · 3.0mm · 0.33mm/px · 7 of 33 slices shown (2 of 3)]
[im 1/33]
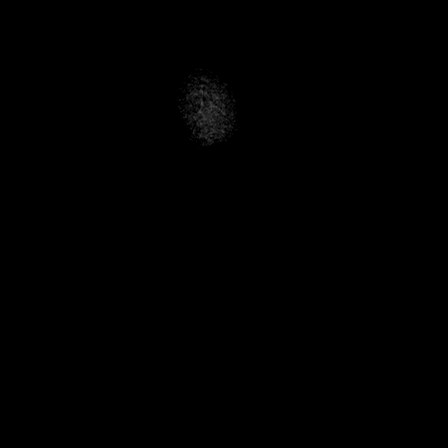
[im 6/33]
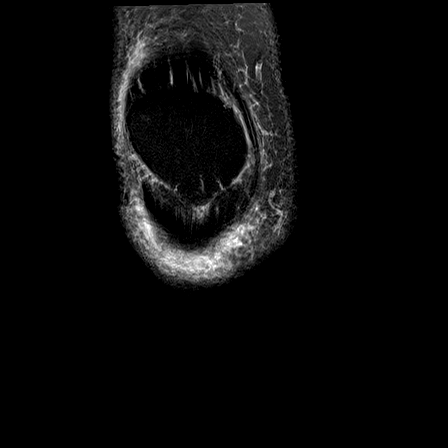
[im 11/33]
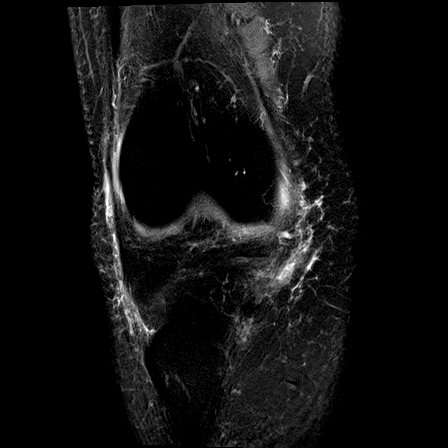
[im 17/33]
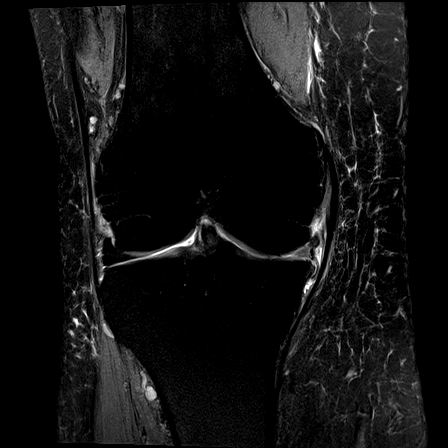
[im 22/33]
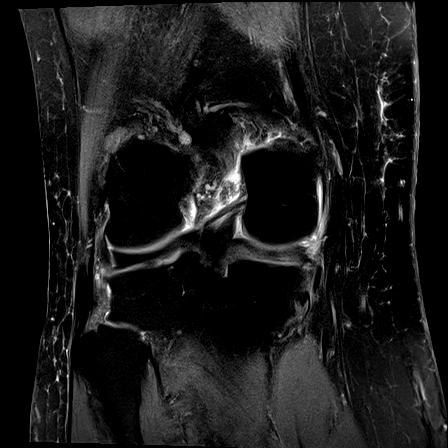
[im 27/33]
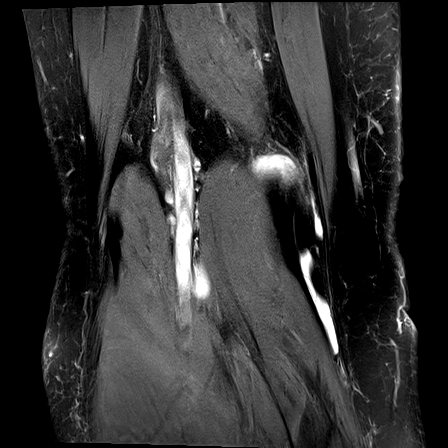
[im 33/33]
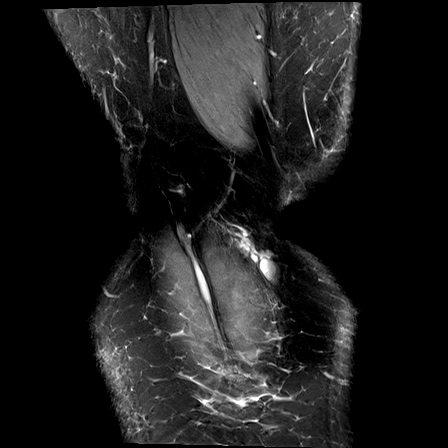

[Series 9: PD fat-sat · sagittal · right · 3.0mm · 0.39mm/px · 6 of 27 slices shown (3 of 3)]
[im 1/27]
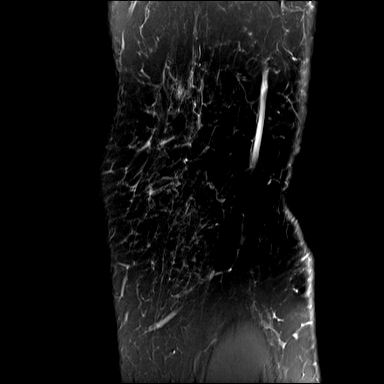
[im 6/27]
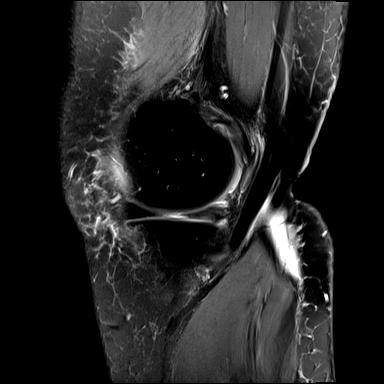
[im 11/27]
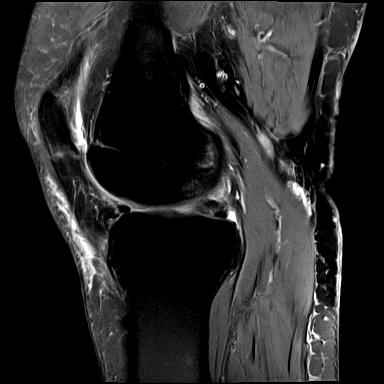
[im 16/27]
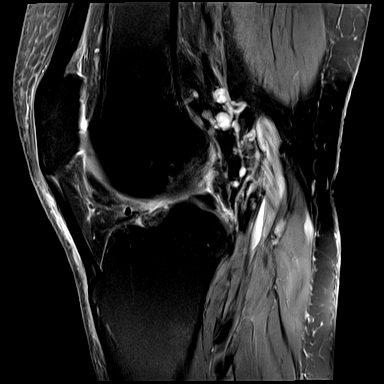
[im 21/27]
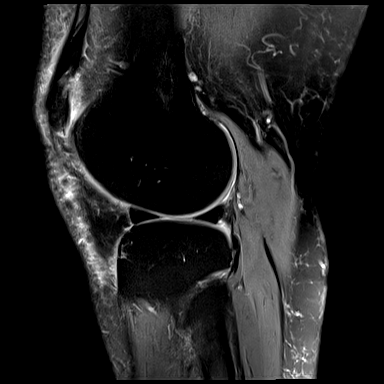
[im 27/27]
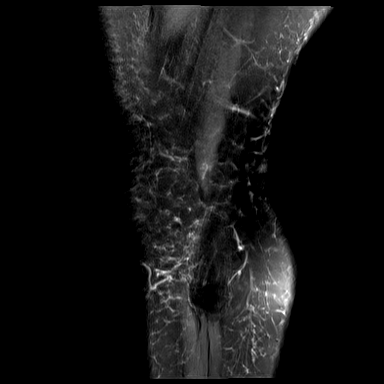

[Series 10: T2 fat-sat · coronal · right · 3.0mm · 0.39mm/px · 7 of 33 slices shown]
[im 1/33]
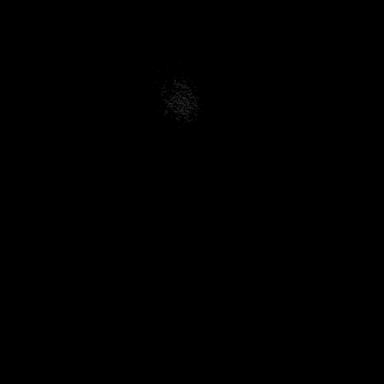
[im 6/33]
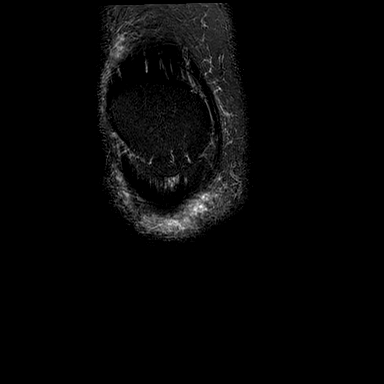
[im 11/33]
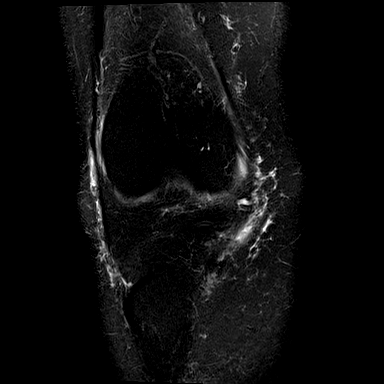
[im 17/33]
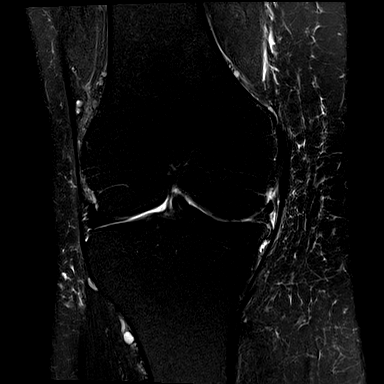
[im 22/33]
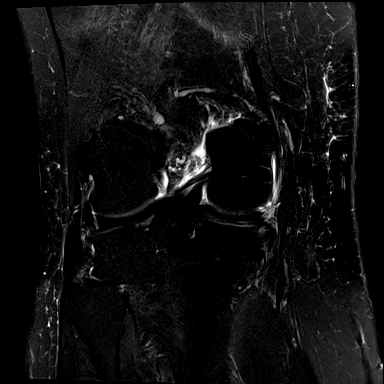
[im 27/33]
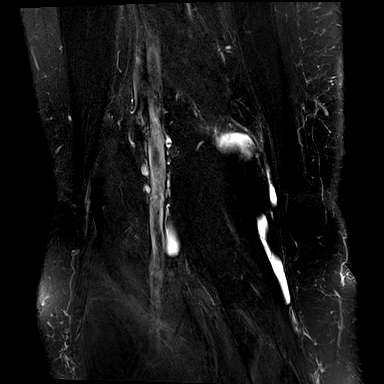
[im 33/33]
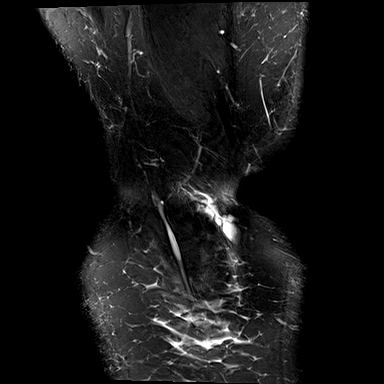

[Series 11: PD · coronal · right · 1.5mm · 0.44mm/px · 5 of 21 slices shown]
[im 1/21]
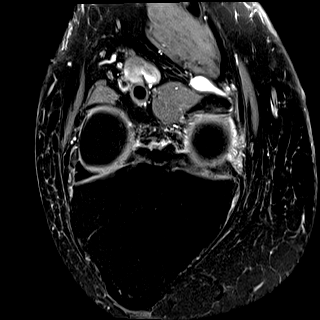
[im 6/21]
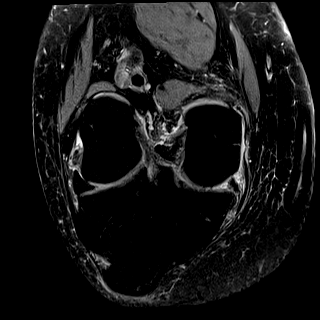
[im 11/21]
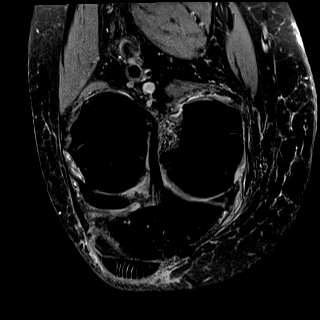
[im 16/21]
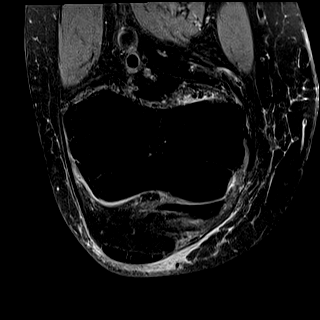
[im 21/21]
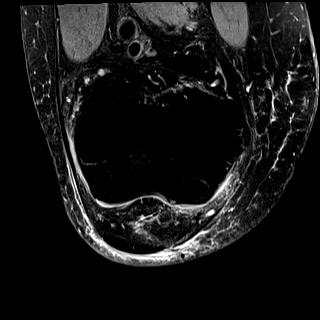

[33 of 40 positions shown; findings below may reference images not displayed]

FINDINGS: MENISCI

Medial meniscus: There is degenerative free edge tearing of the
posterior horn and body of the medial meniscus, best seen on the
coronal images. The meniscal root is intact. There is a possible
small flap fragment extending into the meniscotibial recess. No
centrally displaced meniscal fragment.

Lateral meniscus:  Intact with normal morphology.

LIGAMENTS

Cruciates:  Intact.

Collaterals:  Intact.

CARTILAGE

Patellofemoral: Moderate chondral thinning and surface irregularity
without full-thickness defect or significant subchondral cyst
formation. The trochlear cartilage is preserved.

Medial: Mild chondral thinning and surface irregularity without
focal defect.

Lateral:  Preserved.

MISCELLANEOUS

Joint:  No significant joint effusion.

Popliteal Fossa: Small septated Baker's cyst with superficial
extension along the medial head of the gastrocnemius muscle.

Extensor Mechanism: Mild proximal patellar tendinosis. The
quadriceps tendon appears normal.

Bones:  No acute or significant extra-articular osseous findings.

Other: Mild prepatellar subcutaneous edema. There is also some edema
centrally in Hoffa's fat.
IMPRESSION: 1. Degenerative free edge tear of the posterior horn and body of the
medial meniscus. No displaced meniscal fragment identified.
2. The lateral meniscus, cruciate and collateral ligaments are
intact.
3. Mild degenerative changes, greatest in the patellofemoral
compartment. No acute osseous findings.
4. Mild proximal patellar tendinosis.

## 2019-05-17 DIAGNOSIS — K824 Cholesterolosis of gallbladder: Secondary | ICD-10-CM | POA: Insufficient documentation

## 2021-01-13 DIAGNOSIS — A6920 Lyme disease, unspecified: Secondary | ICD-10-CM

## 2021-01-13 HISTORY — DX: Lyme disease, unspecified: A69.20

## 2021-08-12 DIAGNOSIS — A7749 Other ehrlichiosis: Secondary | ICD-10-CM | POA: Insufficient documentation

## 2021-08-12 DIAGNOSIS — A6920 Lyme disease, unspecified: Secondary | ICD-10-CM | POA: Insufficient documentation

## 2021-08-12 DIAGNOSIS — B6000 Babesiosis, unspecified: Secondary | ICD-10-CM | POA: Insufficient documentation

## 2021-11-28 NOTE — Progress Notes (Signed)
[place] Phone: [phone] Subjective:    I'm seeing this patient by the request  of:  Day, [name], MD  CC: Multiple complaints  [id number]  Belinda Montgomery is a 81 y.o. female coming in with complaint of joint pain.  Patient did not move out of the city and is now back.  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 No past surgical history on file. Social History   Socioeconomic History   Marital status: Divorced    Spouse name: Not on file   Number of children: Not on file   Years of education: Not on file   Highest education level: Not on file  Occupational History   Not on file  Tobacco Use   Smoking status: Former   Smokeless tobacco: Never  Substance and Sexual Activity   Alcohol use: Not on file   Drug use: Not on file   Sexual activity: Not on file  Other Topics Concern   Not on file  Social History Narrative   Not on file   Social Determinants of Health   Financial Resource Strain: Not on file  Food Insecurity: Not on file  Transportation Needs: Not on file  Physical Activity: Not on file  Stress: Not on file  Social Connections: Not on file   No Known Allergies No family history on file.  Current Outpatient Medications (Endocrine & Metabolic):    predniSONE (DELTASONE) 50 MG tablet,  Take 1 tablet (50 mg total) by mouth daily.      Current Outpatient Medications (Other):    Vitamin D, Ergocalciferol, (DRISDOL) 50000 units CAPS capsule, Take 1 capsule (50,000 Units total) by mouth every 7 (seven) days.   Reviewed prior external information including notes and imaging from  primary care provider As well as notes that were available from care everywhere and other healthcare systems.  Past medical history, social, surgical and family history all reviewed in electronic medical record.  No pertanent information unless stated regarding to the chief complaint.   Review of Systems:  No headache, visual changes, nausea, vomiting, diarrhea, constipation, dizziness, abdominal pain, skin rash, fevers, chills, night sweats, weight loss, swollen lymph nodes, body aches, joint swelling, chest pain, shortness of breath, mood changes. POSITIVE muscle aches  Objective  Blood pressure 122/68, pulse 75, height '5\' 6"'$  (1.676 m), weight 163 lb (73.9 kg), SpO2 98 %.   General: No apparent distress alert and oriented x3 mood and affect normal, dressed appropriately.  HEENT: Pupils equal, extraocular movements intact  Respiratory: Patient's speak in full sentences and does not appear short of breath  Cardiovascular: No lower extremity edema, non tender, no erythema  Patient Does Have Swelling Noted.  Positive Grind Test Noted.  Tender to Palpation Diffusely around the Thumb. Patient also has tightness noted of the hamstrings bilaterally.  Tenderness over the ischial areas bilaterally.  Procedure: Real-time Ultrasound Guided Injection of left CMC joint Device: GE Logiq Q7 Ultrasound guided injection is preferred based studies that show increased duration, increased effect, greater accuracy, decreased procedural pain, increased response rate, and decreased cost with ultrasound guided versus blind injection.  Verbal informed consent obtained.  Time-out conducted.  Noted no overlying  erythema, induration, or other signs of local infection.  Skin prepped in a sterile fashion.  Local anesthesia: Topical Ethyl chloride.  With sterile technique and under real time ultrasound guidance: With a 25-gauge half inch needle injected into the left [facility] joint with a total of 0.5 cc of 0.5% Marcaine and 0.5 cc of Kenalog 40 mg/mL Completed without difficulty  Pain immediately resolved suggesting accurate placement of the medication.  Advised to call if fevers/chills, erythema, induration, drainage, or persistent bleeding.  Impression: Technically successful ultrasound guided injection.  Procedure: Real-time Ultrasound Guided Injection of left ischial bursitis Device: GE Logiq Q7 Ultrasound guided injection is preferred based studies that show increased duration, increased effect, greater accuracy, decreased procedural pain, increased response rate, and decreased cost with ultrasound guided versus blind injection.  Verbal informed consent obtained.  Time-out conducted.  Noted no overlying erythema, induration, or other signs of local infection.  Skin prepped in a sterile fashion.  Local anesthesia: Topical Ethyl chloride.  With sterile technique and under real time ultrasound guidance: With a 21-gauge 2 inch needle injected with 0.5 cc of 0.5% Marcaine and 1 cc of Kenalog 40 mg/mL Completed without difficulty  Pain immediately resolved suggesting accurate placement of the medication.  Advised to call if fevers/chills, erythema, induration, drainage, or persistent bleeding.  Impression: Technically successful ultrasound guided injection.   Impression and Recommendations:    The above documentation has been reviewed and is accurate and complete [name], DO

## 2021-12-02 ENCOUNTER — Ambulatory Visit: Payer: Self-pay

## 2021-12-02 ENCOUNTER — Ambulatory Visit (INDEPENDENT_AMBULATORY_CARE_PROVIDER_SITE_OTHER): Payer: Medicare Other | Admitting: Family Medicine

## 2021-12-02 VITALS — BP 122/68 | HR 75 | Ht 66.0 in | Wt 163.0 lb

## 2021-12-02 DIAGNOSIS — G8929 Other chronic pain: Secondary | ICD-10-CM | POA: Diagnosis not present

## 2021-12-02 DIAGNOSIS — M1812 Unilateral primary osteoarthritis of first carpometacarpal joint, left hand: Secondary | ICD-10-CM | POA: Diagnosis not present

## 2021-12-02 DIAGNOSIS — M7072 Other bursitis of hip, left hip: Secondary | ICD-10-CM | POA: Insufficient documentation

## 2021-12-02 DIAGNOSIS — M79645 Pain in left finger(s): Secondary | ICD-10-CM

## 2021-12-02 NOTE — Assessment & Plan Note (Signed)
Patient given injection and tolerated the procedure well, discussed icing regimen and home exercises.  Patient does have a brace that she can wear at night.  Hoping that this does last quite some time.  Patient is 79 but would like to avoid any surgical intervention if possible.  Hopeful that this will be a possibility.

## 2021-12-02 NOTE — Patient Instructions (Signed)
Wear shoes with heels Wear brace at night Hamstring exercises See me in 6-8 weeks

## 2021-12-02 NOTE — Assessment & Plan Note (Signed)
Do believe it is more secondary to the ischial bursitis.  Does have tightness of the hamstrings.  Discussed heel lift, proper shoes, which activities to do which ones to avoid.  Discussed with patient about icing regimen and home exercises.  We did discuss the possibility of lumbar radiculopathy being a concern.  We will need to continue to monitor.  Follow-up again in 6 to 8 weeks.

## 2022-01-08 NOTE — Progress Notes (Signed)
Belinda Montgomery Phone: [phone] Subjective:   [name] No, am serving as a scribe for Dr. Hulan Saas. I'm seeing this patient by the request  of:  Day, Belinda Krauss, MD  CC: Multiple complaints  [id number]  12/02/2021 Do believe it is more secondary to the ischial bursitis.  Does have tightness of the hamstrings.  Discussed heel lift, proper shoes, which activities to do which ones to avoid.  Discussed with patient about icing regimen and home exercises.  We did discuss the possibility of lumbar radiculopathy being a concern.  We will need to continue to monitor.  Follow-up again in 6 to 8 weeks.     Update 01/14/2022 Belinda Montgomery is a 81 y.o. female coming in with complaint of L hip and L thumb pain. Patient states that the injection in hand helped but she continues to have pain.   Also having pain in middle of her back with standing for prolonged periods.   L knee pain over medial aspect that is intermittent. Does occasaionlly brace for support.   Injection in R hip helped and this was helpful. Does yoga and continues to stretch her hamstring.          No past medical history on file. No past surgical history on file. Social History   Socioeconomic History   Marital status: Divorced    Spouse name: Not on file   Number of children: Not on file   Years of education: Not on file   Highest education level: Not on file  Occupational History   Not on file  Tobacco Use   Smoking status: Former   Smokeless tobacco: Never  Substance and Sexual Activity   Alcohol use: Not on file   Drug use: Not on file   Sexual activity: Not on file  Other Topics Concern   Not on file  Social History Narrative   Not on file   Social Determinants of Health   Financial Resource Strain: Not on file  Food Insecurity: Not on file  Transportation Needs: Not on file  Physical Activity: Not on file  Stress:  Not on file  Social Connections: Not on file   No Known Allergies No family history on file.  Current Outpatient Medications (Endocrine & Metabolic):    predniSONE (DELTASONE) 50 MG tablet, Take 1 tablet (50 mg total) by mouth daily.      Current Outpatient Medications (Other):    Vitamin D, Ergocalciferol, (DRISDOL) 50000 units CAPS capsule, Take 1 capsule (50,000 Units total) by mouth every 7 (seven) days.   Reviewed prior external information including notes and imaging from  primary care provider As well as notes that were available from care everywhere and other healthcare systems.  Past medical history, social, surgical and family history all reviewed in electronic medical record.  No pertanent information unless stated regarding to the chief complaint.   Review of Systems:  No headache, visual changes, nausea, vomiting, diarrhea, constipation, dizziness, abdominal pain, skin rash, fevers, chills, night sweats, weight loss, swollen lymph nodes, , joint swelling, chest pain, shortness of breath, mood changes. POSITIVE muscle aches, body aches  Objective  Blood pressure 138/78, pulse 63, height '5\' 6"'$  (1.676 m), weight 163 lb (73.9 kg), SpO2 95 %.   General: No apparent distress alert and oriented x3 mood and affect normal, dressed appropriately.  HEENT: Pupils equal, extraocular movements intact  Respiratory: Patient's speak in full sentences and does  not appear short of breath  Cardiovascular: No lower extremity edema, non tender, no erythema  Patient does have arthritic changes of multiple joints.  CMC joint does have some atrophy noted.  Tenderness to palpation noted.  Low back does have tightness mostly in the thoracolumbar juncture.  Mild midline tenderness.  Limited range of motion in all planes.  Tightness noted to the patient the hamstring area as well.  Positive severe tenderness over the Pes anserine area on the left noted.   Limited muscular skeletal ultrasound  was performed and interpreted by Hulan Saas, M  Limited ultrasound shows the patient does have some very mild hypoechoic changes in the knee seems to be in the patellofemoral joint.  The patient does have hypoechoic changes on the medial aspect that is consistent with Pez anserine bursitis Impression: Pes anserine bursitis   Impression and Recommendations:    The above documentation has been reviewed and is accurate and complete [name], DO

## 2022-01-14 ENCOUNTER — Encounter: Payer: Self-pay | Admitting: Family Medicine

## 2022-01-14 ENCOUNTER — Ambulatory Visit (INDEPENDENT_AMBULATORY_CARE_PROVIDER_SITE_OTHER): Payer: Medicare Other | Admitting: Family Medicine

## 2022-01-14 ENCOUNTER — Ambulatory Visit: Payer: Self-pay

## 2022-01-14 VITALS — BP 138/78 | HR 63 | Ht 66.0 in | Wt 163.0 lb

## 2022-01-14 DIAGNOSIS — M1812 Unilateral primary osteoarthritis of first carpometacarpal joint, left hand: Secondary | ICD-10-CM

## 2022-01-14 DIAGNOSIS — M546 Pain in thoracic spine: Secondary | ICD-10-CM | POA: Diagnosis not present

## 2022-01-14 DIAGNOSIS — M545 Low back pain, unspecified: Secondary | ICD-10-CM

## 2022-01-14 DIAGNOSIS — M7050 Other bursitis of knee, unspecified knee: Secondary | ICD-10-CM

## 2022-01-14 DIAGNOSIS — M79645 Pain in left finger(s): Secondary | ICD-10-CM

## 2022-01-14 DIAGNOSIS — M25552 Pain in left hip: Secondary | ICD-10-CM | POA: Diagnosis not present

## 2022-01-14 DIAGNOSIS — G8929 Other chronic pain: Secondary | ICD-10-CM

## 2022-01-14 DIAGNOSIS — M5126 Other intervertebral disc displacement, lumbar region: Secondary | ICD-10-CM

## 2022-01-14 NOTE — Assessment & Plan Note (Signed)
Left knee noted, discussed hamstring, proper shoes, heel lift, icing regimen.  Follow-up again in 6 to 8 weeks.  May be able to do well with formal physical therapy and aquatic therapy and referred.

## 2022-01-14 NOTE — Assessment & Plan Note (Signed)
Severe arthritic changes noted.  Will refer her to hand surgery to discuss other interventions with patient not making significant improvement with the injections.  Continue bracing at this moment and will start with occupational therapy for more prehab.

## 2022-01-14 NOTE — Patient Instructions (Addendum)
Gravity defyer [place]oka or [facility] recovery sandals OT for L thumb Will look into surgeon Aquatic PT -[place] for back See me again in 4-6 weeks

## 2022-01-14 NOTE — Assessment & Plan Note (Signed)
History of significant degenerative disc disease of the back.  Also may respond well to aquatic therapy with patient having worsening symptoms at this time.

## 2022-02-19 NOTE — Progress Notes (Unsigned)
[place] [address] [place] Phone: [phone] Subjective:   IVilma Meckel, am serving as a scribe for Dr. Hulan Saas.  I'm seeing this patient by the request  of:  Day, [name], MD  CC:   ION:[id number]  01/14/2022 Severe arthritic changes noted. Will refer her to hand surgery to discuss other interventions with patient not making significant improvement with the injections. Continue bracing at this moment and will start with occupational therapy for more prehab.   Left knee noted, discussed hamstring, proper shoes, heel lift, icing regimen.  Follow-up again in 6 to 8 weeks.  May be able to do well with formal physical therapy and aquatic therapy and referred.    Update 02/20/2022 Belinda Montgomery is a 82 y.o. female coming in with complaint of L hip and L thumb pain. Patient states all aliments are progressively getting worse. Seeing rheumatology in April. Started PT. Saw hand ortho surgeon, wants other options.      No past medical history on file. No past surgical history on file. Social History   Socioeconomic History   Marital status: Divorced    Spouse name: Not on file   Number of children: Not on file   Years of education: Not on file   Highest education level: Not on file  Occupational History   Not on file  Tobacco Use   Smoking status: Former   Smokeless tobacco: Never  Substance and Sexual Activity   Alcohol use: Not on file   Drug use: Not on file   Sexual activity: Not on file  Other Topics Concern   Not on file  Social History Narrative   Not on file   Social Determinants of Health   Financial Resource Strain: Not on file  Food Insecurity: Not on file  Transportation Needs: Not on file  Physical Activity: Not on file  Stress: Not on file  Social Connections: Not on file   No Known Allergies No family history on file.  Current Outpatient Medications (Endocrine & Metabolic):     predniSONE (DELTASONE) 50 MG tablet, Take 1 tablet (50 mg total) by mouth daily.      Current Outpatient Medications (Other):    Vitamin D, Ergocalciferol, (DRISDOL) 1.25 MG (50000 UNIT) CAPS capsule, Take 1 capsule (50,000 Units total) by mouth every 7 (seven) days.   Reviewed prior external information including notes and imaging from  primary care provider As well as notes that were available from care everywhere and other healthcare systems.  Reviewed patient's note with hematology for her CLL recently.  Past medical history, social, surgical and family history all reviewed in electronic medical record.  No pertanent information unless stated regarding to the chief complaint.   Review of Systems:  No headache, visual changes, nausea, vomiting, diarrhea, constipation, dizziness, abdominal pain, skin rash, fevers, chills, night sweats, weight loss, swollen lymph nodes,  joint swelling, chest pain, shortness of breath, mood changes. POSITIVE muscle aches, body aches  Objective  Blood pressure 116/62, pulse 90, height '5\' 6"'$  (1.676 m), weight 161 lb (73 kg), SpO2 (!) 67 %.   General: No apparent distress alert and oriented x3 mood and affect normal, dressed appropriately.  HEENT: Pupils equal, extraocular movements intact  Respiratory: Patient's speak in full sentences and does not appear short of breath  Cardiovascular: No lower extremity edema, non tender, no erythema  Patient's CMC joint on the left side does have some arthritic changes noted.  Some atrophy noted of the thenar eminence.  Foot exam shows the patient does have breakdown of the transverse arch on the left and right foot.  Splaying between the first and second toes with standing.    Impression and Recommendations:    The above documentation has been reviewed and is accurate and complete [name], DO

## 2022-02-20 ENCOUNTER — Ambulatory Visit (INDEPENDENT_AMBULATORY_CARE_PROVIDER_SITE_OTHER): Payer: Medicare Other | Admitting: Family Medicine

## 2022-02-20 VITALS — BP 116/62 | HR 90 | Ht 66.0 in | Wt 161.0 lb

## 2022-02-20 DIAGNOSIS — M1812 Unilateral primary osteoarthritis of first carpometacarpal joint, left hand: Secondary | ICD-10-CM | POA: Diagnosis not present

## 2022-02-20 DIAGNOSIS — E559 Vitamin D deficiency, unspecified: Secondary | ICD-10-CM

## 2022-02-20 DIAGNOSIS — A6920 Lyme disease, unspecified: Secondary | ICD-10-CM | POA: Diagnosis not present

## 2022-02-20 DIAGNOSIS — M216X2 Other acquired deformities of left foot: Secondary | ICD-10-CM

## 2022-02-20 DIAGNOSIS — C9110 Chronic lymphocytic leukemia of B-cell type not having achieved remission: Secondary | ICD-10-CM | POA: Diagnosis not present

## 2022-02-20 MED ORDER — VITAMIN D (ERGOCALCIFEROL) 1.25 MG (50000 UNIT) PO CAPS: 50000.0000 [IU] | ORAL_CAPSULE | ORAL | 0 refills | Status: DC

## 2022-02-20 NOTE — Patient Instructions (Addendum)
OT will call you Once weekly Vit D I will reach out to hematology Try new shoe insert See me in 2 months

## 2022-02-20 NOTE — Assessment & Plan Note (Signed)
History of Lyme disease, did have Lyme titers that were negative at the hematologist and infectious disease.  Encouraged her to continue to follow-up with them.

## 2022-02-20 NOTE — Assessment & Plan Note (Signed)
Concerned that some of the discomfort and pain could be secondary to the CLL.  Patient did have a fairly large increase in the white blood cells from 17,000-24,000 on her last lab.  Discussed with patient due to this change we may want to consider the possibility of repeating sooner than 1 year.  Patient will discuss this with her hematologist.

## 2022-02-20 NOTE — Assessment & Plan Note (Signed)
Breakdown of the transverse arch noted on the right side.  Metatarsal/cookie pad for added to the left foot.  Hopefully this helps with some of the discomfort.  Hoping that she makes a big difference.

## 2022-02-20 NOTE — Assessment & Plan Note (Signed)
Once weekly prescription of vitamin D given.  Hopefully this will be beneficial.

## 2022-02-20 NOTE — Assessment & Plan Note (Signed)
Patient did see Copy.  Is waiting another week and will try an injection again.  We attempted injection with no significant benefit.  Discussed which activities to do and which ones to avoid.  Discussed bracing still at night and topical anti-inflammatories.  Patient is not willing to have the surgery at this time.  Follow-up with me again 2 to 3 months

## 2022-03-03 NOTE — Therapy (Signed)
OUTPATIENT OCCUPATIONAL THERAPY ORTHO EVALUATION  Patient Name: Belinda Montgomery MRN: [id number] DOB:Jun 29, 1940, 82 y.o., female Today's Date: 03/04/2022  PCP: Day, [name], MD REFERRING PROVIDER: [name], DO  END OF SESSION:  OT End of Session - 03/04/22 1425     Visit Number 1    Number of Visits 9    Date for OT Re-Evaluation 05/03/22    Authorization Type MCR    Progress Note Due on Visit 10    OT Start Time 1100    OT Stop Time 1145    OT Time Calculation (min) 45 min    Activity Tolerance Patient tolerated treatment well    Behavior During Therapy [facility] for tasks assessed/performed             No past medical history on file. No past surgical history on file. 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   Lyme disease 08/12/2021   Gallbladder polyp 05/17/2019   CLL (chronic lymphocytic leukemia) ([place]) 12/07/2018   Iron deficiency anemia 08/03/2018   Hypercholesterolemia 07/08/2018   Right knee meniscal tear 12/10/2016   Hypothyroidism 07/29/2016   Sacral pain 02/14/2016   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   History of melanoma 03/06/2014   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   History of basal cell carcinoma 01/20/2011    ONSET DATE: 02/20/2022 (referral date)   REFERRING DIAG: M18.12 (ICD-10-CM) - Arthritis of carpometacarpal (CMC) joint of left thumb  THERAPY DIAG:  Pain in left hand  Stiffness of left hand, not elsewhere classified  Muscle weakness (generalized)  Rationale for Evaluation and Treatment: Rehabilitation  SUBJECTIVE:   SUBJECTIVE STATEMENT: My joint pain has come about the same time as the Lymes Pt accompanied by:  self  PERTINENT HISTORY: chronic lymphocytic leukemia (CLL), h/o basal cell carcinoma, Lymes dz,   PRECAUTIONS: None and Other: avoid heat modalities  WEIGHT BEARING RESTRICTIONS: No  PAIN:  Are you having pain? Yes: NPRS scale: 0-9/10 Pain location: Lt thumb Pain description: shooting, achy, sore Aggravating factors: certain movements, repetitive/overuse Relieving factors: brace, topical rubs  FALLS: Has patient fallen in last 6 months? No  LIVING ENVIRONMENT: Lives with: lives with their spouse Lives in: House/apartment, 2story Has following equipment at home: None  PLOF: Independent  PATIENT GOALS: reduce pain, use hands  NEXT MD VISIT: 04/15/22 w/ Dr. Tamala Julian  OBJECTIVE:   HAND DOMINANCE: Right  ADLs:  Eating: independent    Grooming: independent UB Dressing: mod I - slower LB Dressing: mod I - slower and difficulty w/ donning compression stockings Toileting: independent Bathing: independent Tub Shower transfers: independent Equipment: none **Pt reports difficulty opening jars, chopping, cooking  FUNCTIONAL OUTCOME MEASURES: Quick Dash: 56.82% disability (re: LUE)   UPPER EXTREMITY ROM:   BUE AROM WNL's except Lt hand PIP and DIP joints limited. Lt thumb ROM WFL's however pain with movement especially palmer and radial abduction   HAND FUNCTION: Grip strength: Right: 52.6 lbs; Left: 42.1 lbs and  Lateral pinch: Right: 13 lbs, Left: 12 lbs 3 tip pinch: RT - 11 lbs, Lt - 10 lbs w/ pain  COORDINATION: 9 Hole Peg test: Right: 24.55 sec; Left: 27.75 sec  SENSATION: Intermittent both hands  EDEMA:  joint swelling especially DIP joints, however no fluid swelling  COGNITION: Overall cognitive status:  pt reports occasional memory deficits but intact for all functional tasks  OBSERVATIONS: joint deformity in DIP joints Lt > Rt (but beginning in Rt hand)    TODAY'S TREATMENT:                                                                                                                               N/A today   PATIENT EDUCATION: Education details: OT POC Person educated: Patient Education method: Explanation Education comprehension: verbalized understanding  HOME EXERCISE PROGRAM: N/A during evaluation  GOALS: Goals reviewed with patient? Yes  SHORT TERM GOALS: Target date: 04/02/22  Pt will verbalize understanding with splint/brace wear and care Baseline: Goal status: INITIAL  2.  Pt will verbalize understanding with joint protection strategies and task modifications w/ ADLs to reduce pain Baseline:  Goal status: INITIAL  3.  Independent with HEP to maintain/increase Lt hand ROM for thumb and digit IP joints Baseline:  Goal status: INITIAL  4.  Pt to verbalize understanding of potential A/E needs including: jar openers, ergonomic/large handled knives, etc Baseline:  Goal status: INITIAL   LONG TERM GOALS: Target date: 05/03/22  Pt to verbalize understanding of updated strengthening HEP  Baseline:  Goal status: INITIAL  2.  Grip strength Lt hand to be 45 lbs or greater Baseline: 42 lbs Goal status: INITIAL  3.  Pt to reduce disability on Quick Dash to 48% or less  Baseline: 56.82% Goal status: INITIAL    ASSESSMENT:  CLINICAL IMPRESSION: Patient is a 82 y.o. female who was seen today for occupational therapy evaluation for Lt thumb CMC arthritis. Pt presents today with pain mostly in thumb, limited PIP/DIP ROM in digits, increased pain. Pt reports this all began around the same time as Lyme's disease. Pt would benefit from O.T. to address these deficits, educate patient on pain management strategies including: joint protection, task modifications, brace/splint recommendations and to regain ROM Lt hand  PERFORMANCE DEFICITS: in functional skills including ADLs, IADLs, coordination, dexterity, edema, ROM, strength, pain, body mechanics, decreased knowledge of use of DME, and UE functional use.   IMPAIRMENTS: are  limiting patient from ADLs, IADLs, and leisure.   COMORBIDITIES: has co-morbidities such as Lyme's, CLL  that affects occupational performance. Patient will benefit from skilled OT to address above impairments and improve overall function.  MODIFICATION OR ASSISTANCE TO COMPLETE EVALUATION: No modification of tasks or assist necessary to complete an evaluation.  OT OCCUPATIONAL PROFILE AND HISTORY: Detailed assessment: Review of records and additional review of physical, cognitive, psychosocial history related to current functional performance.  CLINICAL DECISION MAKING: Moderate - several treatment options, min-mod task modification necessary  REHAB POTENTIAL: Good  EVALUATION COMPLEXITY: Low      PLAN:  OT FREQUENCY: 1-2x/week  OT DURATION: 8 weeks (Duration longer for scheduling conflicts and pt prefers 1x/wk)  PLANNED INTERVENTIONS: self care/ADL training, therapeutic  exercise, therapeutic activity, manual therapy, passive range of motion, splinting, paraffin, fluidotherapy, moist heat, patient/family education, and DME and/or AE instructions  RECOMMENDED OTHER SERVICES: none at this time  CONSULTED AND AGREED WITH PLAN OF CARE: Patient  PLAN FOR NEXT SESSION: pt to bring in both braces to assess - pt may benefit from neoprene CMC arthritis splint, A/E needs and joint protection strategies   Hans Eden, OT 03/04/2022, 2:26 PM

## 2022-03-04 ENCOUNTER — Ambulatory Visit: Payer: Medicare Other | Attending: Family Medicine | Admitting: Occupational Therapy

## 2022-03-04 DIAGNOSIS — M79642 Pain in left hand: Secondary | ICD-10-CM | POA: Insufficient documentation

## 2022-03-04 DIAGNOSIS — M1812 Unilateral primary osteoarthritis of first carpometacarpal joint, left hand: Secondary | ICD-10-CM | POA: Diagnosis not present

## 2022-03-04 DIAGNOSIS — M6281 Muscle weakness (generalized): Secondary | ICD-10-CM | POA: Diagnosis present

## 2022-03-04 DIAGNOSIS — M25642 Stiffness of left hand, not elsewhere classified: Secondary | ICD-10-CM | POA: Diagnosis present

## 2022-03-14 NOTE — Therapy (Signed)
OUTPATIENT OCCUPATIONAL THERAPY TREATMENT NOTE   Patient Name: Belinda Montgomery MRN: [id number] DOB:1940-09-28, 82 y.o., female Today's Date: 03/19/2022  PCP: Day, [name], MD REFERRING PROVIDER: [name], DO  END OF SESSION:  OT End of Session - 03/19/22 1319     Visit Number 2    Number of Visits 9    Date for OT Re-Evaluation 05/03/22    Authorization Type MCR    Progress Note Due on Visit 10    OT Start Time 1319    OT Stop Time 1403    OT Time Calculation (min) 44 min    Activity Tolerance Patient tolerated treatment well;No increased pain;Patient limited by pain    Behavior During Therapy [facility] for tasks assessed/performed             History reviewed. No pertinent past medical history. History reviewed. No pertinent surgical history. 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   Lyme disease 08/12/2021   Gallbladder polyp 05/17/2019   CLL (chronic lymphocytic leukemia) ([place]) 12/07/2018   Iron deficiency anemia 08/03/2018   Hypercholesterolemia 07/08/2018   Right knee meniscal tear 12/10/2016   Hypothyroidism 07/29/2016   Sacral pain 02/14/2016   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   History of melanoma 03/06/2014   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   History of basal cell carcinoma 01/20/2011    ONSET DATE: 02/20/2022 (referral date)   REFERRING DIAG: M18.12 (ICD-10-CM) - Arthritis of carpometacarpal (CMC) joint of left thumb  THERAPY DIAG:  Pain in left hand  Stiffness of left hand, not elsewhere classified  Muscle weakness (generalized)  Rationale for Evaluation and Treatment: Rehabilitation  PERTINENT HISTORY: chronic lymphocytic  leukemia (CLL), h/o basal cell carcinoma, Lymes dz,   PRECAUTIONS: None and Other: avoid heat modalities; WEIGHT BEARING RESTRICTIONS: No   SUBJECTIVE:   SUBJECTIVE STATEMENT: She states Lyme's 2 years ago, and she is not being treated for CLL- it's watchful waiting.  She is fine to use any modalities as required.  She also states feeling a lot of relief after CMC thumb injection very little pain now.  She does want to learn management techniques to prevent future exacerbations and avoid surgery.   PAIN:  Are you having pain? Yes: NPRS scale: 2/10 Pain location: Lt thumb Pain description: shooting, achy, sore Aggravating factors: certain movements, repetitive/overuse Relieving factors: brace, topical rubs  FALLS: Has patient fallen in last 6 months? No  LIVING ENVIRONMENT: Lives with: lives with their spouse Lives in: House/apartment, 2story Has following equipment at home: None  PLOF: Independent  PATIENT GOALS: reduce pain, use hands  NEXT MD VISIT: 04/15/22 w/ Dr. Tamala Julian   OBJECTIVE: (All objective assessments below are from initial evaluation on: 03/04/22 unless otherwise specified.)   HAND DOMINANCE: Right  ADLs: LB Dressing: mod I - slower and difficulty w/ donning compression stockings. Pt also reports difficulty opening jars, chopping, cooking  FUNCTIONAL OUTCOME MEASURES: Quick Dash: 56.82% disability (re: LUE)   UPPER EXTREMITY ROM:   BUE AROM WNL's except Lt hand PIP and DIP joints limited. Lt thumb ROM WFL's however pain with movement especially palmer and radial abduction   HAND FUNCTION: Grip strength: Right: 52.6 lbs; Left: 42.1 lbs  and  Lateral pinch: Right: 13 lbs, Left: 12 lbs 3 tip pinch: RT - 11 lbs, Lt - 10 lbs w/ pain  COORDINATION: 9 Hole Peg test: Right: 24.55 sec; Left: 27.75 sec  SENSATION: Intermittent both hands  EDEMA: joint swelling especially DIP joints, however no fluid swelling  OBSERVATIONS: joint deformity in DIP joints Lt > Rt  (but beginning in Rt hand)    TODAY'S TREATMENT:                                                                                                                              03/19/22: The brace she is wearing some sort of prefabricated wrist strap with a thumb hole which does not seem to be useful for her condition.  OT advises to have a custom thumb brace made in the next session as time allows.  For today's session is more important to provide a home exercise program to address her pain and stiffness in her thumb and fingers.  OT provides the below home exercises and reviews them each with her today.  (As bolded below) other exercises will need reviewed in the upcoming sessions as well as modality use of moist heat.  She leaves and no extra pain stating feeling pretty good after her recent injection.  OT also educates her for self-care to avoid repetitive or painful gripping and postures.  She states understanding  Exercises - HOOK Stretch  - 4 x daily - 3-5 reps - 15-20 sec hold - Tendon Glides  - 4-6 x daily - 3-5 reps - 2-3 seconds hold - Seated Wrist Flexion Stretch  - 3 x daily - 3 reps - 15 hold - Wrist Prayer Stretch  - 3 x daily - 3 reps - 15 sec hold - Stretch Thumb DOWNWARD  - 3 x daily - 3 reps - 15 sec hold - Thumb Webspace Stretch  - 3 x daily - 3 reps - 15 sec hold - Towel Roll Grip with Forearm in Neutral  - 3 x daily - 5 reps - 10 sec hold   PATIENT EDUCATION: Education details: OT POC Person educated: Patient Education method: Explanation Education comprehension: verbalized understanding  HOME EXERCISE PROGRAM: Access Code: [facility] URL: [email or web address] Date: 03/19/2022 Prepared by: Benito Mccreedy  GOALS: Goals reviewed with patient? Yes  SHORT TERM GOALS: Target date: 04/02/22  Pt will verbalize understanding with splint/brace wear and care Baseline: Goal status: INITIAL  2.  Pt will verbalize understanding with joint protection strategies  and task modifications w/ ADLs to reduce pain Baseline:  Goal status: INITIAL  3.  Independent with HEP to maintain/increase Lt hand ROM for thumb and digit IP joints Baseline:  Goal status: INITIAL  4.  Pt to verbalize understanding of potential A/E needs including: jar openers, ergonomic/large handled knives, etc Baseline:  Goal status: INITIAL   LONG TERM GOALS: Target date: 05/03/22  Pt to verbalize understanding of updated strengthening HEP  Baseline:  Goal status: INITIAL  2.  Grip strength Lt hand to be 45 lbs or greater Baseline: 42 lbs Goal status: INITIAL  3.  Pt to reduce disability on Quick Dash to 48% or less  Baseline: 56.82% Goal status: INITIAL    ASSESSMENT:  CLINICAL IMPRESSION: 03/19/22: She is managing well after her injection which is a great sign that she should not need additional surgery or fusions in the future.  We will try to manage conservatively with bracing and home exercise program and heat modalities.  Continue plan of care   PLAN:  OT FREQUENCY: 1-2x/week  OT DURATION: 8 weeks (Duration longer for scheduling conflicts and pt prefers 1x/wk)  PLANNED INTERVENTIONS: self care/ADL training, therapeutic exercise, therapeutic activity, manual therapy, passive range of motion, splinting, paraffin, fluidotherapy, moist heat, patient/family education, and DME and/or AE instructions  CONSULTED AND AGREED WITH PLAN OF CARE: Patient  PLAN FOR NEXT SESSION:  Review home exercise program use heat modality and manual therapy as needed, finish this home exercise program and also fabricate a thumb spica hand-based orthotic to help support the [facility] joint.  Benito Mccreedy, OT 03/19/2022, 5:03 PM

## 2022-03-19 ENCOUNTER — Encounter: Payer: Self-pay | Admitting: Rehabilitative and Restorative Service Providers"

## 2022-03-19 ENCOUNTER — Ambulatory Visit: Payer: Medicare Other | Attending: Family Medicine | Admitting: Rehabilitative and Restorative Service Providers"

## 2022-03-19 DIAGNOSIS — M25642 Stiffness of left hand, not elsewhere classified: Secondary | ICD-10-CM | POA: Diagnosis present

## 2022-03-19 DIAGNOSIS — M79642 Pain in left hand: Secondary | ICD-10-CM | POA: Diagnosis present

## 2022-03-19 DIAGNOSIS — M6281 Muscle weakness (generalized): Secondary | ICD-10-CM | POA: Diagnosis present

## 2022-03-25 NOTE — Therapy (Signed)
OUTPATIENT OCCUPATIONAL THERAPY TREATMENT NOTE   Patient Name: Belinda Montgomery MRN: [id number] DOB:1940/12/08, 82 y.o., female Today's Date: 03/19/2022  PCP: Day, [name], MD REFERRING PROVIDER: [name], DO  END OF SESSION:  OT End of Session - 03/19/22 1319     Visit Number 2    Number of Visits 9    Date for OT Re-Evaluation 05/03/22    Authorization Type MCR    Progress Note Due on Visit 10    OT Start Time 1319    OT Stop Time 1403    OT Time Calculation (min) 44 min    Activity Tolerance Patient tolerated treatment well;No increased pain;Patient limited by pain    Behavior During Therapy [facility] for tasks assessed/performed             History reviewed. No pertinent past medical history. History reviewed. No pertinent surgical history. 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   Lyme disease 08/12/2021   Gallbladder polyp 05/17/2019   CLL (chronic lymphocytic leukemia) ([place]) 12/07/2018   Iron deficiency anemia 08/03/2018   Hypercholesterolemia 07/08/2018   Right knee meniscal tear 12/10/2016   Hypothyroidism 07/29/2016   Sacral pain 02/14/2016   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   History of melanoma 03/06/2014   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   History of basal cell carcinoma 01/20/2011    ONSET DATE: 02/20/2022 (referral date)   REFERRING DIAG: M18.12 (ICD-10-CM) - Arthritis of carpometacarpal (CMC) joint of left thumb  THERAPY DIAG:  Pain in left hand  Stiffness of left hand, not elsewhere classified  Muscle weakness (generalized)  Rationale for Evaluation and Treatment: Rehabilitation  PERTINENT HISTORY: chronic lymphocytic  leukemia (CLL), h/o basal cell carcinoma, Lymes dz,   PRECAUTIONS: None and Other: avoid heat modalities; WEIGHT BEARING RESTRICTIONS: No   SUBJECTIVE:   SUBJECTIVE STATEMENT: She states ***   PAIN:  Are you having pain? Yes: NPRS scale: *** 2/10 Pain location: Lt thumb Pain description: shooting, achy, sore Aggravating factors: certain movements, repetitive/overuse Relieving factors: brace, topical rubs  FALLS: Has patient fallen in last 6 months? No  LIVING ENVIRONMENT: Lives with: lives with their spouse Lives in: House/apartment, 2story Has following equipment at home: None  PLOF: Independent  PATIENT GOALS: reduce pain, use hands  NEXT MD VISIT: 04/15/22 w/ Dr. Tamala Julian   OBJECTIVE: (All objective assessments below are from initial evaluation on: 03/04/22 unless otherwise specified.)   HAND DOMINANCE: Right  ADLs: LB Dressing: mod I - slower and difficulty w/ donning compression stockings. Pt also reports difficulty opening jars, chopping, cooking  FUNCTIONAL OUTCOME MEASURES: Quick Dash: 56.82% disability (re: LUE)   UPPER EXTREMITY ROM:   BUE AROM WNL's except Lt hand PIP and DIP joints limited. Lt thumb ROM WFL's however pain with movement especially palmer and radial abduction   HAND FUNCTION: Grip strength: Right: 52.6 lbs; Left: 42.1 lbs and  Lateral pinch: Right: 13 lbs, Left: 12 lbs 3 tip pinch: RT - 11 lbs, Lt - 10 lbs w/ pain  COORDINATION: 9 Hole Peg test: Right: 24.55 sec; Left: 27.75 sec  SENSATION: Intermittent both hands  EDEMA: joint swelling especially DIP joints, however no fluid swelling  OBSERVATIONS: joint deformity  in DIP joints Lt > Rt (but beginning in Rt hand)    TODAY'S TREATMENT:                                                                                                                              03/26/22: *** Review home exercise program use heat modality and manual therapy as needed, finish this home exercise program and  also fabricate a Lt thumb spica hand-based orthotic to help support the [facility] joint.  Exercises - HOOK Stretch  - 4 x daily - 3-5 reps - 15-20 sec hold - Tendon Glides  - 4-6 x daily - 3-5 reps - 2-3 seconds hold - Seated Wrist Flexion Stretch  - 3 x daily - 3 reps - 15 hold - Wrist Prayer Stretch  - 3 x daily - 3 reps - 15 sec hold - Stretch Thumb DOWNWARD  - 3 x daily - 3 reps - 15 sec hold - Thumb Webspace Stretch  - 3 x daily - 3 reps - 15 sec hold - Towel Roll Grip with Forearm in Neutral  - 3 x daily - 5 reps - 10 sec hold   PATIENT EDUCATION: Education details: OT POC Person educated: Patient Education method: Explanation Education comprehension: verbalized understanding  HOME EXERCISE PROGRAM: Access Code: [facility] URL: [email or web address]    GOALS: Goals reviewed with patient? Yes  SHORT TERM GOALS: Target date: 04/02/22  Pt will verbalize understanding with splint/brace wear and care Baseline: Goal status: INITIAL  2.  Pt will verbalize understanding with joint protection strategies and task modifications w/ ADLs to reduce pain Baseline:  Goal status: INITIAL  3.  Independent with HEP to maintain/increase Lt hand ROM for thumb and digit IP joints Baseline:  Goal status: INITIAL  4.  Pt to verbalize understanding of potential A/E needs including: jar openers, ergonomic/large handled knives, etc Baseline:  Goal status: INITIAL   LONG TERM GOALS: Target date: 05/03/22  Pt to verbalize understanding of updated strengthening HEP  Baseline:  Goal status: INITIAL  2.  Grip strength Lt hand to be 45 lbs or greater Baseline: 42 lbs Goal status: INITIAL  3.  Pt to reduce disability on Quick Dash to 48% or less  Baseline: 56.82% Goal status: INITIAL    ASSESSMENT:  CLINICAL IMPRESSION: 03/26/22: ***  03/19/22: She is managing well after her injection which is a great sign that she should not need additional surgery or fusions in the future.   We will try to manage conservatively with bracing and home exercise program and heat modalities.  Continue plan of care   PLAN:  OT FREQUENCY: 1-2x/week  OT DURATION: 8 weeks (Duration longer for scheduling conflicts and pt prefers 1x/wk)  PLANNED INTERVENTIONS: self care/ADL training, therapeutic exercise, therapeutic activity, manual therapy, passive range of motion, splinting, paraffin, fluidotherapy, moist heat, patient/family education, and DME and/or AE instructions  CONSULTED AND AGREED WITH PLAN OF CARE: Patient  PLAN FOR NEXT SESSION:  ***  Benito Mccreedy, OT 03/19/2022, 5:03 PM

## 2022-03-26 ENCOUNTER — Encounter: Payer: Self-pay | Admitting: Rehabilitative and Restorative Service Providers"

## 2022-03-26 ENCOUNTER — Ambulatory Visit: Payer: Medicare Other | Admitting: Rehabilitative and Restorative Service Providers"

## 2022-03-26 DIAGNOSIS — M79642 Pain in left hand: Secondary | ICD-10-CM

## 2022-03-26 DIAGNOSIS — M6281 Muscle weakness (generalized): Secondary | ICD-10-CM

## 2022-03-26 DIAGNOSIS — M25642 Stiffness of left hand, not elsewhere classified: Secondary | ICD-10-CM

## 2022-04-01 NOTE — Therapy (Signed)
OUTPATIENT OCCUPATIONAL THERAPY TREATMENT NOTE   Patient Name: Belinda Montgomery MRN: [id number] DOB:1940/08/13, 82 y.o., female Today's Date: 04/02/2022  PCP: Day, [name], MD REFERRING PROVIDER: [name], DO  END OF SESSION:  OT End of Session - 04/02/22 1105     Visit Number 4    Number of Visits 9    Date for OT Re-Evaluation 05/03/22    Authorization Type MCR    Progress Note Due on Visit 10    OT Start Time 1105    OT Stop Time 1149    OT Time Calculation (min) 44 min    Equipment Utilized During Treatment --    Activity Tolerance Patient tolerated treatment well;No increased pain    Behavior During Therapy [facility] for tasks assessed/performed             History reviewed. No pertinent past medical history. History reviewed. No pertinent surgical history. 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   Lyme disease 08/12/2021   Gallbladder polyp 05/17/2019   CLL (chronic lymphocytic leukemia) ([place]) 12/07/2018   Iron deficiency anemia 08/03/2018   Hypercholesterolemia 07/08/2018   Right knee meniscal tear 12/10/2016   Hypothyroidism 07/29/2016   Sacral pain 02/14/2016   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   History of melanoma 03/06/2014   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   History of basal cell carcinoma 01/20/2011    ONSET DATE: 02/20/2022 (referral date)   REFERRING DIAG: M18.12 (ICD-10-CM) - Arthritis of carpometacarpal (CMC) joint of left thumb  THERAPY DIAG:  Stiffness of left hand, not elsewhere classified  Pain in left hand  Muscle weakness (generalized)  Rationale for Evaluation and Treatment: Rehabilitation  PERTINENT HISTORY: chronic  lymphocytic leukemia (CLL), h/o basal cell carcinoma, Lymes dz,   PRECAUTIONS: None and Other: avoid heat modalities; WEIGHT BEARING RESTRICTIONS: No   SUBJECTIVE:   SUBJECTIVE STATEMENT: She states her orthotic fits well but needs one minor adjustment.    PAIN:  Are you having pain? Yes: NPRS scale: 0/10 Pain location: Lt thumb Pain description: shooting, achy, sore Aggravating factors: certain movements, repetitive/overuse Relieving factors: brace, topical rubs  FALLS: Has patient fallen in last 6 months? No  LIVING ENVIRONMENT: Lives with: lives with their spouse Lives in: House/apartment, 2story Has following equipment at home: None  PLOF: Independent  PATIENT GOALS: reduce pain, use hands  NEXT MD VISIT: 04/15/22 w/ Dr. Tamala Julian   OBJECTIVE: (All objective assessments below are from initial evaluation on: 03/04/22 unless otherwise specified.)   HAND DOMINANCE: Right  ADLs: LB Dressing: mod I - slower and difficulty w/ donning compression stockings. Pt also reports difficulty opening jars, chopping, cooking  FUNCTIONAL OUTCOME MEASURES: Quick Dash: 56.82% disability (re: LUE)   UPPER EXTREMITY ROM:   BUE AROM WNL's except Lt hand PIP and DIP joints limited. Lt thumb ROM WFL's however pain with movement especially palmer and radial abduction   HAND FUNCTION: Grip strength: Right: 52.6 lbs; Left: 42.1 lbs and  Lateral pinch: Right: 13 lbs, Left: 12 lbs 3 tip pinch: RT - 11 lbs, Lt - 10 lbs w/ pain  COORDINATION: 9 Hole Peg test: Right: 24.55 sec; Left: 27.75 sec  SENSATION: Intermittent both hands  EDEMA:  joint swelling especially DIP joints, however no fluid swelling  OBSERVATIONS: joint deformity in DIP joints Lt > Rt (but beginning in Rt hand)    TODAY'S TREATMENT:                                                                                                                              04/02/22: OT adjusts orthotic, adds pad which feels better. OT then  reviews and finishes HEP edu with her (as below) and she states tolerating well, no pain, states understanding. She also asks about gardening tasks and other typically painful tasks, and OT suggests FNL modifications to prevent pain/overuse.  She stats understanding all management and HEP and would like to self-manage for 2 weeks.    Exercises - HOOK Stretch  - 4 x daily - 3-5 reps - 15-20 sec hold - Tendon Glides  - 4-6 x daily - 3-5 reps - 2-3 seconds hold - Seated Wrist Flexion Stretch  - 3 x daily - 3 reps - 15 hold - Wrist Prayer Stretch  - 3 x daily - 3 reps - 15 sec hold - Stretch Thumb DOWNWARD  - 3 x daily - 3 reps - 15 sec hold - Thumb Webspace Stretch  - 3 x daily - 3 reps - 15 sec hold - Towel Roll Grip with Forearm in Neutral  - 3 x daily - 5 reps - 10 sec hold  PATIENT EDUCATION: Education details: OT POC Person educated: Patient Education method: Explanation Education comprehension: verbalized understanding  HOME EXERCISE PROGRAM: Access Code: [facility] URL: [email or web address]   GOALS: Goals reviewed with patient? Yes  SHORT TERM GOALS: Target date: 04/02/22  Pt will verbalize understanding with splint/brace wear and care Baseline: Goal status: 04/02/22 MET  2.  Pt will verbalize understanding with joint protection strategies and task modifications w/ ADLs to reduce pain Baseline:  Goal status: 04/02/22 MET  3.  Independent with HEP to maintain/increase Lt hand ROM for thumb and digit IP joints Baseline:  Goal status: 04/02/22 MET  4.  Pt to verbalize understanding of potential A/E needs including: jar openers, ergonomic/large handled knives, etc Baseline:  Goal status:04/02/22 MET   LONG TERM GOALS: Target date: 05/03/22  Pt to verbalize understanding of updated strengthening HEP  Baseline:  Goal status: INITIAL  2.  Grip strength Lt hand to be 45 lbs or greater Baseline: 42 lbs Goal status: INITIAL  3.  Pt to reduce disability on Quick  Dash to 48% or less  Baseline: 56.82% Goal status: INITIAL    ASSESSMENT:  CLINICAL IMPRESSION: 04/02/22: She is doing great, no pain, will attempt HEP and self-management for 2 week, then return   03/26/22: She is doing well learning new stretches and now has a brace to support her painful and sore thumb joint.  Exercises will need reviewed and new exercises educated upon as well as education for strategies to compensate or modify tasks to avoid arthritic pain.  PLAN:  OT FREQUENCY: 1-2x/week  OT DURATION: 8 weeks (Duration longer for scheduling conflicts and pt prefers 1x/wk)  PLANNED INTERVENTIONS: self care/ADL training, therapeutic exercise, therapeutic activity, manual therapy, passive range of motion, splinting, paraffin, fluidotherapy, moist heat, patient/family education, and DME and/or AE instructions  CONSULTED AND AGREED WITH PLAN OF CARE: Patient  PLAN FOR NEXT SESSION:  Check status in 2 week,s may be fit for D/C if all goals met   Benito Mccreedy, OT 04/02/2022, 1:17 PM

## 2022-04-02 ENCOUNTER — Ambulatory Visit: Payer: Medicare Other | Admitting: Rehabilitative and Restorative Service Providers"

## 2022-04-02 ENCOUNTER — Encounter: Payer: Self-pay | Admitting: Rehabilitative and Restorative Service Providers"

## 2022-04-02 DIAGNOSIS — M6281 Muscle weakness (generalized): Secondary | ICD-10-CM

## 2022-04-02 DIAGNOSIS — M79642 Pain in left hand: Secondary | ICD-10-CM | POA: Diagnosis not present

## 2022-04-02 DIAGNOSIS — M25642 Stiffness of left hand, not elsewhere classified: Secondary | ICD-10-CM

## 2022-04-03 NOTE — Progress Notes (Signed)
Belinda Montgomery Belinda Montgomery Phone: [phone] Subjective:   Belinda Montgomery, am serving as a scribe for Dr. Hulan Saas.  I'm seeing this patient by the request  of:  Day, Belinda Krauss, MD  CC: Left thumb pain, other problems.  [id number]  02/20/2022 Breakdown of the transverse arch noted on the right side.  Metatarsal/cookie pad for added to the left foot.  Hopefully this helps with some of the discomfort.  Hoping that she makes a big difference   Once weekly prescription of vitamin D given. Hopefully this will be beneficial.   History of Lyme disease, did have Lyme titers that were negative at the hematologist and infectious disease.  Encouraged her to continue to follow-up with them.     Patient did see Copy. Is waiting another week and will try an injection again. We attempted injection with Montgomery significant benefit. Discussed which activities to do and which ones to avoid. Discussed bracing still at night and topical anti-inflammatories. Patient is not willing to have the surgery at this time. Follow-up with me again 2 to 3 months   Concerned that some of the discomfort and pain could be secondary to the CLL.  Patient did have a fairly large increase in the white blood cells from 17,000-24,000 on her last lab.  Discussed with patient due to this change we may want to consider the possibility of repeating sooner than 1 year.  Patient will discuss this with her hematologist.      Update 04/15/2022 SECRET CRADLE is a 82 y.o. female coming in with complaint of L thumb and B foot pain. Is currently in OT.Patient states that she saw Dr. Burney Gauze. Patent had injection and is wearing brace. Having very little pain after injection. Will f/u with him in June.   Not having pain in feet. Does have some pain in great toe due to an ingrown toenail.   Patient has been going to occupational therapy regularly for the hand.     Montgomery  past medical history on file. Montgomery past surgical history on file. Social History   Socioeconomic History   Marital status: Divorced    Spouse name: Not on file   Number of children: Not on file   Years of education: Not on file   Highest education level: Not on file  Occupational History   Not on file  Tobacco Use   Smoking status: Former   Smokeless tobacco: Never  Substance and Sexual Activity   Alcohol use: Not on file   Drug use: Not on file   Sexual activity: Not on file  Other Topics Concern   Not on file  Social History Narrative   Not on file   Social Determinants of Health   Financial Resource Strain: Not on file  Food Insecurity: Not on file  Transportation Needs: Not on file  Physical Activity: Not on file  Stress: Not on file  Social Connections: Not on file   Montgomery Known Allergies Montgomery family history on file.  Current Outpatient Medications (Endocrine & Metabolic):    predniSONE (DELTASONE) 50 MG tablet, Take 1 tablet (50 mg total) by mouth daily.      Current Outpatient Medications (Other):    Vitamin D, Ergocalciferol, (DRISDOL) 1.25 MG (50000 UNIT) CAPS capsule, Take 1 capsule (50,000 Units total) by mouth every 7 (seven) days.   Reviewed prior external information including notes and imaging from  primary care provider As  well as notes that were available from care everywhere and other healthcare systems.  Past medical history, social, surgical and family history all reviewed in electronic medical record.  Montgomery pertanent information unless stated regarding to the chief complaint.   Review of Systems:  Montgomery headache, visual changes, nausea, vomiting, diarrhea, constipation, dizziness, abdominal pain, skin rash, fevers, chills, night sweats, weight loss, swollen lymph nodes, body aches, joint swelling, chest pain, shortness of breath, mood changes. POSITIVE muscle aches  Objective  Blood pressure 120/72, pulse 66, height 5\' 6"  (1.676 m), weight 160 lb  (72.6 kg), SpO2 98 %.   General: Montgomery apparent distress alert and oriented x3 mood and affect normal, dressed appropriately.  HEENT: Pupils equal, extraocular movements intact  Respiratory: Patient's speak in full sentences and does not appear short of breath  Cardiovascular: Montgomery lower extremity edema, non tender, Montgomery erythema  Left CMC joint does have some positive grind test noted..  Mild thenar eminence wasting noted. Right first toe does show that there is ingrown over the medial aspect and then nailbed.  Some granulation tissue noted.  Montgomery sign of any active infection at the moment.  Tender to palpation over this area.  Mild onychomycosis noted as well Low back does have some loss of lordosis noted.     Impression and Recommendations:    The above documentation has been reviewed and is accurate and complete Belinda Pulley, DO

## 2022-04-09 ENCOUNTER — Ambulatory Visit: Payer: Medicare Other | Admitting: Rehabilitative and Restorative Service Providers"

## 2022-04-14 ENCOUNTER — Encounter: Payer: Medicare Other | Admitting: Occupational Therapy

## 2022-04-15 ENCOUNTER — Other Ambulatory Visit: Payer: Self-pay

## 2022-04-15 ENCOUNTER — Encounter: Payer: Self-pay | Admitting: Family Medicine

## 2022-04-15 ENCOUNTER — Ambulatory Visit (INDEPENDENT_AMBULATORY_CARE_PROVIDER_SITE_OTHER): Payer: Medicare Other | Admitting: Family Medicine

## 2022-04-15 VITALS — BP 120/72 | HR 66 | Ht 66.0 in | Wt 160.0 lb

## 2022-04-15 DIAGNOSIS — E559 Vitamin D deficiency, unspecified: Secondary | ICD-10-CM

## 2022-04-15 DIAGNOSIS — R109 Unspecified abdominal pain: Secondary | ICD-10-CM

## 2022-04-15 DIAGNOSIS — M2550 Pain in unspecified joint: Secondary | ICD-10-CM

## 2022-04-15 DIAGNOSIS — L600 Ingrowing nail: Secondary | ICD-10-CM

## 2022-04-15 DIAGNOSIS — M79642 Pain in left hand: Secondary | ICD-10-CM | POA: Diagnosis not present

## 2022-04-15 DIAGNOSIS — C9110 Chronic lymphocytic leukemia of B-cell type not having achieved remission: Secondary | ICD-10-CM

## 2022-04-15 DIAGNOSIS — K824 Cholesterolosis of gallbladder: Secondary | ICD-10-CM

## 2022-04-15 DIAGNOSIS — M1812 Unilateral primary osteoarthritis of first carpometacarpal joint, left hand: Secondary | ICD-10-CM

## 2022-04-15 DIAGNOSIS — L03031 Cellulitis of right toe: Secondary | ICD-10-CM

## 2022-04-15 NOTE — Patient Instructions (Addendum)
GI referral Podiatry referral Labs  Pause on PT See me in 3 months

## 2022-04-15 NOTE — Assessment & Plan Note (Signed)
Has been 3 months and did have a significant spike in the white blood cell counts from previous exam so we will have her recheck these.  Encouraged her to follow-up with her rheumatologist.

## 2022-04-15 NOTE — Assessment & Plan Note (Signed)
Continue to wear the brace on a regular basis.  I discussed that we can repeat injections as needed.  Can refer to surgery again if her orthopedic surgeon retires.

## 2022-04-15 NOTE — Assessment & Plan Note (Signed)
History of gallbladder removal, continues to have some gut dysfunction noted.  Discussed with patient recently did have a colonoscopy from another provider for someone more local.

## 2022-04-15 NOTE — Assessment & Plan Note (Signed)
Referred to podiatry for further evaluation 

## 2022-04-16 NOTE — Therapy (Signed)
OUTPATIENT OCCUPATIONAL THERAPY TREATMENT & DISCHARGE NOTE   Patient Name: Belinda Montgomery MRN: [id number] DOB:1940/10/14, 82 y.o., female Today's Date: 04/21/2022  PCP: Day, Pernell Dupre, MD REFERRING PROVIDER: Judi Saa, DO  END OF SESSION:  OT End of Session - 04/21/22 1054     Visit Number 5    Number of Visits 9    Date for OT Re-Evaluation 05/03/22    Authorization Type MCR    Progress Note Due on Visit 10    OT Start Time 1059    OT Stop Time 1139    OT Time Calculation (min) 40 min    Activity Tolerance Patient tolerated treatment well;No increased pain    Behavior During Therapy [facility] for tasks assessed/performed              History reviewed. No pertinent past medical history. History reviewed. No pertinent surgical history. 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   Lyme disease 08/12/2021   Gallbladder polyp 05/17/2019   CLL (chronic lymphocytic leukemia) 12/07/2018   Iron deficiency anemia 08/03/2018   Hypercholesterolemia 07/08/2018   Right knee meniscal tear 12/10/2016   Hypothyroidism 07/29/2016   Sacral pain 02/14/2016   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   History of melanoma 03/06/2014   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   History of basal cell carcinoma 01/20/2011    ONSET DATE: 02/20/2022 (referral date)   REFERRING DIAG: M18.12 (ICD-10-CM) - Arthritis of carpometacarpal (CMC) joint of left thumb  THERAPY DIAG:  Stiffness of left hand, not elsewhere classified  Pain in left hand  Muscle weakness (generalized)  Rationale for Evaluation and Treatment:  Rehabilitation  PERTINENT HISTORY: chronic lymphocytic leukemia (CLL), h/o basal cell carcinoma, Lymes dz,   PRECAUTIONS: None and Other: avoid heat modalities; WEIGHT BEARING RESTRICTIONS: No   SUBJECTIVE:   SUBJECTIVE STATEMENT: She states brace needs new strapping, but otherwise fits well, is helping. She got compressive gloves and has been doing HEP.    PAIN:  Are you having pain? Not at rest 0/10 Pain location: Lt thumb   PATIENT GOALS: reduce pain, use hands    OBJECTIVE: (All objective assessments below are from initial evaluation on: 03/04/22 unless otherwise specified.)   HAND DOMINANCE: Right  ADLs: Rates now mild- no problems for all with Quick DASH    FUNCTIONAL OUTCOME MEASURES: 04/21/22: Quick DASH: 22% today  Eval: Quick Dash: 56.82% disability (re: LUE)   UPPER EXTREMITY ROM:   BUE AROM WNL's except Lt hand PIP and DIP joints limited. Lt thumb ROM WFL's however pain with movement especially palmer and radial abduction   HAND FUNCTION: 04/21/22: Grip strength Rt: 61#, Lt: 42# Lat pinch Rt: 14#, Lt: 15# 3pt pinch Rt: 11#; Lt: 10#   Grip strength: Right: 52.6 lbs; Left: 42.1 lbs and  Lateral pinch: Right: 13 lbs, Left: 12 lbs 3 tip pinch: RT - 11 lbs, Lt - 10 lbs w/ pain   TODAY'S TREATMENT:  04/21/22: OT provides additional strapping for orthotic, reviews HEP, reviews goals with her, she performs back all exercises as below and also does gripping/pinching for check of status. She's doing much better, discusses ADLs and rates disability down >50% compared to start of care now. OT alos provides built-up foam to modify handles and discusses adapting techniques and environments to avoid pain exacerbations. She states understanding.   Exercises - HOOK Stretch  - 4 x daily - 3-5 reps - 15-20 sec hold - Tendon Glides  - 4-6 x daily - 3-5 reps  - 2-3 seconds hold - Seated Wrist Flexion Stretch  - 3 x daily - 3 reps - 15 hold - Wrist Prayer Stretch  - 3 x daily - 3 reps - 15 sec hold - Stretch Thumb DOWNWARD  - 3 x daily - 3 reps - 15 sec hold - Thumb Webspace Stretch  - 3 x daily - 3 reps - 15 sec hold - Towel Roll Grip with Forearm in Neutral  - 3 x daily - 5 reps - 10 sec hold  PATIENT EDUCATION: Education details: OT POC Person educated: Patient Education method: Explanation Education comprehension: verbalized understanding  HOME EXERCISE PROGRAM: Access Code: [facility] URL: [email or web address]   GOALS: Goals reviewed with patient? Yes  SHORT TERM GOALS: Target date: 04/02/22  Pt will verbalize understanding with splint/brace wear and care Baseline: Goal status: 04/02/22 MET  2.  Pt will verbalize understanding with joint protection strategies and task modifications w/ ADLs to reduce pain Baseline:  Goal status: 04/02/22 MET  3.  Independent with HEP to maintain/increase Lt hand ROM for thumb and digit IP joints Baseline:  Goal status: 04/02/22 MET  4.  Pt to verbalize understanding of potential A/E needs including: jar openers, ergonomic/large handled knives, etc Baseline:  Goal status:04/02/22 MET   LONG TERM GOALS: Target date: 05/03/22  Pt to verbalize understanding of updated strengthening HEP  Baseline:  Goal status: 04/21/22: MET  2.  Grip strength Lt hand to be 45 lbs or greater Baseline: 42 lbs Goal status: 04/21/22: NOT MET, but no pain and Rt hand did improve by 10#.   3.  Pt to reduce disability on Quick Dash to 48% or less  Baseline: 56.82% Goal status: 04/21/22: MET 22% now!     ASSESSMENT:  CLINICAL IMPRESSION: 04/21/22: She ha met most goals, maximized her potential through OP OT now, states understanding OA management and feeling/doing much better now. Rates disability down from 56% to 22% now.  Discharge today.    PLAN:  OT FREQUENCY: D/C; OT DURATION: D/C  PLANNED  INTERVENTIONS: self care/ADL training, therapeutic exercise, therapeutic activity, manual therapy, passive range of motion, splinting, paraffin, fluidotherapy, moist heat, patient/family education, and DME and/or AE instructions  CONSULTED AND AGREED WITH PLAN OF CARE: Patient  PLAN FOR NEXT SESSION:  D/C now   Fannie Knee, OT 04/21/2022, 11:44 AM   OCCUPATIONAL THERAPY DISCHARGE SUMMARY  Visits from Start of Care: 5  Current functional level related to goals / functional outcomes: Pt has met all goals to satisfactory levels and is pleased with outcomes.   Remaining deficits: Pt has no more significant functional deficits or pain.   Education / Equipment: Pt has all needed materials and education. Pt understands how to continue on with self-management. See tx notes for more details.   Patient agrees to discharge due to max benefits received from outpatient occupational therapy / hand therapy at this time.   Fannie Knee, OTR/L, CHT 04/21/22

## 2022-04-21 ENCOUNTER — Encounter: Payer: Self-pay | Admitting: Rehabilitative and Restorative Service Providers"

## 2022-04-21 ENCOUNTER — Ambulatory Visit: Payer: Medicare Other | Attending: Family Medicine | Admitting: Rehabilitative and Restorative Service Providers"

## 2022-04-21 DIAGNOSIS — M25642 Stiffness of left hand, not elsewhere classified: Secondary | ICD-10-CM | POA: Diagnosis not present

## 2022-04-21 DIAGNOSIS — M6281 Muscle weakness (generalized): Secondary | ICD-10-CM | POA: Insufficient documentation

## 2022-04-21 DIAGNOSIS — M79642 Pain in left hand: Secondary | ICD-10-CM

## 2022-04-30 ENCOUNTER — Ambulatory Visit (INDEPENDENT_AMBULATORY_CARE_PROVIDER_SITE_OTHER): Payer: Medicare Other | Admitting: Podiatry

## 2022-04-30 ENCOUNTER — Encounter: Payer: Medicare Other | Admitting: Occupational Therapy

## 2022-04-30 DIAGNOSIS — L600 Ingrowing nail: Secondary | ICD-10-CM

## 2022-04-30 NOTE — Progress Notes (Unsigned)
  Subjective:  Patient ID: Belinda Montgomery, female    DOB: 05/05/1940,  MRN: [id number]  Chief Complaint  Patient presents with   Nail Problem    82 y.o. female presents with the above complaint.  Patient presents with right hallux lateral border ingrown pain for touch is  Worse worse with ambulation worse with pressure she would like to be removed she has not seen MRIs prior to seeing me pain scale 7 out of 10 dull achy in nature hurts with some type of shoes.   Review of Systems: Negative except as noted in the HPI. Denies N/V/F/Ch.  No past medical history on file.  Current Outpatient Medications:    predniSONE (DELTASONE) 50 MG tablet, Take 1 tablet (50 mg total) by mouth daily., Disp: 5 tablet, Rfl: 0   Vitamin D, Ergocalciferol, (DRISDOL) 1.25 MG (50000 UNIT) CAPS capsule, Take 1 capsule (50,000 Units total) by mouth every 7 (seven) days., Disp: 12 capsule, Rfl: 0  Social History   Tobacco Use  Smoking Status Former  Smokeless Tobacco Never    No Known Allergies Objective:  There were no vitals filed for this visit. There is no height or weight on file to calculate BMI. Constitutional Well developed. Well nourished.  Vascular Dorsalis pedis pulses palpable bilaterally. Posterior tibial pulses palpable bilaterally. Capillary refill normal to all digits.  No cyanosis or clubbing noted. Pedal hair growth normal.  Neurologic Normal speech. Oriented to person, place, and time. Epicritic sensation to light touch grossly present bilaterally.  Dermatologic Painful ingrowing nail at lateral nail borders of the hallux nail right. No other open wounds. No skin lesions.  Orthopedic: Normal joint ROM without pain or crepitus bilaterally. No visible deformities. No bony tenderness.   Radiographs: None Assessment:  No diagnosis found. Plan:  Patient was evaluated and treated and all questions answered.  Ingrown Nail, right -Patient elects to proceed with minor surgery  to remove ingrown toenail removal today. Consent reviewed and signed by patient. -Ingrown nail excised. See procedure note. -Educated on post-procedure care including soaking. Written instructions provided and reviewed. -Patient to follow up in 2 weeks for nail check.  Procedure: Excision of Ingrown Toenail Location: Right 1st toe lateral nail borders. Anesthesia: Lidocaine 1% plain; 1.5 mL and Marcaine 0.5% plain; 1.5 mL, digital block. Skin Prep: Betadine. Dressing: Silvadene; telfa; dry, sterile, compression dressing. Technique: Following skin prep, the toe was exsanguinated and a tourniquet was secured at the base of the toe. The affected nail border was freed, split with a nail splitter, and excised. Chemical matrixectomy was then performed with phenol and irrigated out with alcohol. The tourniquet was then removed and sterile dressing applied. Disposition: Patient tolerated procedure well. Patient to return in 2 weeks for follow-up.   No follow-ups on file.

## 2022-05-07 ENCOUNTER — Encounter: Payer: Medicare Other | Admitting: Occupational Therapy

## 2022-05-28 ENCOUNTER — Other Ambulatory Visit: Payer: Self-pay | Admitting: Family Medicine

## 2022-07-14 NOTE — Progress Notes (Unsigned)
[facility] [address] [place] [address] Phone: [phone] Subjective:   Belinda Montgomery, am serving as a scribe for Dr. Antoine Primas.  I'm seeing this patient by the request  of:  Day, Pernell Dupre, MD  CC: left CMC pain   NUU:[id number]  04/15/2022 Continue to wear the brace on a regular basis.  I discussed that we can repeat injections as needed.  Can refer to surgery again if her orthopedic surgeon retires.     Has been 3 months and did have a significant spike in the white blood cell Montgomery from previous exam so we will have her recheck these.  Encouraged her to follow-up with her rheumatologist.   Update 07/15/2022 Belinda Montgomery is a 82 y.o. female coming in with complaint of L CMC joint pain. Patient states wrist are doing okay. Take a look at right knee. R shoulder pain for the last couple of months and getting progressively worse.        No past medical history on file. No past surgical history on file. Social History   Socioeconomic History   Marital status: Married    Spouse name: Not on file   Number of children: Not on file   Years of education: Not on file   Highest education level: Not on file  Occupational History   Not on file  Tobacco Use   Smoking status: Former   Smokeless tobacco: Never  Substance and Sexual Activity   Alcohol use: Not on file   Drug use: Not on file   Sexual activity: Not on file  Other Topics Concern   Not on file  Social History Narrative   Not on file   Social Determinants of Health   Financial Resource Strain: Not on file  Food Insecurity: Not on file  Transportation Needs: Not on file  Physical Activity: Not on file  Stress: Not on file  Social Connections: Not on file   No Known Allergies No family history on file.  Current Outpatient Medications (Endocrine & Metabolic):    predniSONE (DELTASONE) 50 MG tablet, Take 1 tablet (50 mg total) by mouth daily.      Current  Outpatient Medications (Other):    Vitamin D, Ergocalciferol, (DRISDOL) 1.25 MG (50000 UNIT) CAPS capsule, TAKE 1 CAPSULE BY MOUTH EVERY 7 DAYS   Reviewed prior external information including notes and imaging from  primary care provider As well as notes that were available from care everywhere and other healthcare systems.  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  Did have CT scan of this abdomen showing that there is splenomegaly but no lymphadenopathy.  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  Past medical history, social, surgical and family history all reviewed in electronic medical record.  No pertanent information unless stated regarding to the chief complaint.   Review of Systems:  No headache, visual changes, nausea, vomiting, diarrhea, constipation, dizziness, abdominal pain, skin rash, fevers, chills, night sweats, weight loss, swollen lymph nodes, body aches, joint swelling, chest pain, shortness of breath, mood changes. POSITIVE muscle aches  Objective  Blood pressure 128/70, pulse 74, height 5\' 6"  (1.676 m), weight 158 lb (71.7 kg), SpO2 95 %.   General: No apparent distress alert and oriented x3 mood and affect normal, dressed appropriately.  HEENT: Pupils equal, extraocular movements intact  Respiratory: Patient's speak in full sentences and does not appear short of breath  Cardiovascular: No lower extremity edema, non tender, no erythema  Antalgic gait noted.  Right knee still has pes anserine being tender and does have swelling noted.  Good range of motion noted. Low back does have loss of lordosis noted. Arthritic changes noted in the [facility] joint with a positive grind.  No significant weakness noted. Right shoulder does have positive impingement noted.  Positive crossover test noted.  Limited muscular skeletal ultrasound was  performed and interpreted by Antoine Primas, M   Limited ultrasound shows significant narrowing of the acromioclavicular joint with significant hypoechoic changes consistent with an effusion. Impression: AC arthritis with effusion  Procedure: Real-time Ultrasound Guided Injection of right acromioclavicular joint Device: GE Logiq Q7 Ultrasound guided injection is preferred based studies that show increased duration, increased effect, greater accuracy, decreased procedural pain, increased response rate, and decreased cost with ultrasound guided versus blind injection.  Verbal informed consent obtained.  Time-out conducted.  Noted no overlying erythema, induration, or other signs of local infection.  Skin prepped in a sterile fashion.  Local anesthesia: Topical Ethyl chloride.  With sterile technique and under real time ultrasound guidance: With a 25-gauge half inch needle injected with 0.5 cc of 0.5% Marcaine and 0.5 cc of Kenalog 40 mg/mL Completed without difficulty  Pain immediately improved suggesting accurate placement of the medication.  Advised to call if fevers/chills, erythema, induration, drainage, or persistent bleeding.  Impression: Technically successful ultrasound guided injection.    Impression and Recommendations:    The above documentation has been reviewed and is accurate and complete Judi Saa, DO

## 2022-07-15 ENCOUNTER — Encounter: Payer: Self-pay | Admitting: Family Medicine

## 2022-07-15 ENCOUNTER — Other Ambulatory Visit: Payer: Self-pay

## 2022-07-15 ENCOUNTER — Ambulatory Visit (INDEPENDENT_AMBULATORY_CARE_PROVIDER_SITE_OTHER): Payer: MEDICARE | Admitting: Family Medicine

## 2022-07-15 VITALS — BP 128/70 | HR 74 | Ht 66.0 in | Wt 158.0 lb

## 2022-07-15 DIAGNOSIS — M25511 Pain in right shoulder: Secondary | ICD-10-CM

## 2022-07-15 DIAGNOSIS — M7050 Other bursitis of knee, unspecified knee: Secondary | ICD-10-CM | POA: Diagnosis not present

## 2022-07-15 DIAGNOSIS — M19011 Primary osteoarthritis, right shoulder: Secondary | ICD-10-CM

## 2022-07-15 DIAGNOSIS — D72820 Lymphocytosis (symptomatic): Secondary | ICD-10-CM

## 2022-07-15 DIAGNOSIS — M19019 Primary osteoarthritis, unspecified shoulder: Secondary | ICD-10-CM | POA: Insufficient documentation

## 2022-07-15 DIAGNOSIS — M2550 Pain in unspecified joint: Secondary | ICD-10-CM | POA: Diagnosis not present

## 2022-07-15 LAB — IBC PANEL
Iron: 83 ug/dL (ref 42–145)
Saturation Ratios: 26.1 % (ref 20.0–50.0)
TIBC: 317.8 ug/dL (ref 250.0–450.0)
Transferrin: 227 mg/dL (ref 212.0–360.0)

## 2022-07-15 LAB — SEDIMENTATION RATE: Sed Rate: 17 mm/hr (ref 0–30)

## 2022-07-15 NOTE — Assessment & Plan Note (Signed)
Continues to have difficulty.  Patient would like anemia checked especially for iron levels.

## 2022-07-15 NOTE — Assessment & Plan Note (Signed)
Still present the patient is able to lift conservative therapy.  Will continue to monitor.

## 2022-07-15 NOTE — Patient Instructions (Addendum)
Injection in [facility] [place],The joint today Do prescribed exercises at least 3x a week Labs today See you again in 2-3 months

## 2022-07-15 NOTE — Assessment & Plan Note (Signed)
Injection given and tolerated the procedure well, discussed icing regimen and home exercises, discussed which activities to do and which ones to avoid.  If worsening pain advanced imaging would be warranted with patient's history of CLL and melanoma.  Sedimentation rate ordered today as well.  Home exercises given.  Follow-up again in 6 to 8 weeks

## 2022-07-21 ENCOUNTER — Other Ambulatory Visit: Payer: Self-pay | Admitting: Family Medicine

## 2022-10-10 NOTE — Progress Notes (Unsigned)
[name] [address] [place] [address] Phone: [phone] Subjective:   [name], am serving as a scribe for Dr. Antoine Primas.  I'm seeing this patient by the request  of:  Day, Pernell Dupre, MD  CC: Many different complaints  HQI:[id number]  07/15/2022 Injection given and tolerated the procedure well, discussed icing regimen and home exercises, discussed which activities to do and which ones to avoid.  If worsening pain advanced imaging would be warranted with patient's history of CLL and melanoma.  Sedimentation rate ordered today as well.  Home exercises given.  Follow-up again in 6 to 8 weeks     Continues to have difficulty.  Patient would like anemia checked especially for iron levels.     Still present the patient is able to lift conservative therapy.  Will continue to monitor.     Update 10/14/2022 Belinda Montgomery is a 82 y.o. female coming in with complaint of R shoulder and knee pain. Patient states L hand and wrist pain. Also shoulder and back pain. Wrist is doing the worst.  Has CMC arthritis.  Is going to see another provider for this and discussed the potential for surgical intervention.  Feels like having more discomfort in the legs.  Affecting daily activities as well.  Patient feels like she is continuing to have significant fatigue.  Being worked up for more of the CLL and the iron deficiency.  Has seen a new hematologist this week and is hoping for other treatment options.  Patient is just wondering what else we could consider at this point.      No past medical history on file. No past surgical history on file. Social History   Socioeconomic History   Marital status: Married    Spouse name: Not on file   Number of children: Not on file   Years of education: Not on file   Highest education level: Not on file  Occupational History   Not on file  Tobacco Use   Smoking status: Former   Smokeless tobacco: Never   Substance and Sexual Activity   Alcohol use: Not on file   Drug use: Not on file   Sexual activity: Not on file  Other Topics Concern   Not on file  Social History Narrative   Not on file   Social Determinants of Health   Financial Resource Strain: Medium Risk (08/12/2019)   Received from [facility] visits prior to 03/15/2022., [facility] visits prior to 03/15/2022.   Overall Financial Resource Strain (CARDIA)    Difficulty of Paying Living Expenses: Somewhat hard  Food Insecurity: Low Risk  (08/05/2022)   [facility]   Hunger Vital Sign    Worried About Running Out of Food in the Last Year: Never true    Ran Out of Food in the Last Year: Never true  Transportation Needs: Not on file (08/05/2022)  Physical Activity: Insufficiently Active (08/12/2019)   Received from [facility] visits prior to 03/15/2022., [facility] visits prior to 03/15/2022.   Exercise Vital Sign    Days of Exercise per Week: 3 days    Minutes of Exercise per Session: 20 min  Stress: No Stress Concern Present (08/12/2019)   Received from [facility] visits prior to 03/15/2022., [facility] visits prior to 03/15/2022.   [employer] of Occupational Health - Occupational  Stress Questionnaire    Feeling of Stress : Only a little  Social Connections: Moderately Integrated (08/12/2019)   Received from [facility] visits prior to 03/15/2022., [facility] visits prior to 03/15/2022.   Social Advertising account executive [NHANES]    Frequency of Communication with Friends and Family: More than three times a week    Frequency of Social Gatherings with Friends and Family: Once a week    Attends Religious Services: More than 4 times per year    Active Member of [employer] or Organizations: Yes    Attends Engineer, structural: More than 4  times per year    Marital Status: Divorced   No Known Allergies No family history on file.  Current Outpatient Medications (Endocrine & Metabolic):    predniSONE (DELTASONE) 50 MG tablet, Take 1 tablet (50 mg total) by mouth daily.      Current Outpatient Medications (Other):    Vitamin D, Ergocalciferol, (DRISDOL) 1.25 MG (50000 UNIT) CAPS capsule, TAKE 1 CAPSULE BY MOUTH EVERY 7 DAYS   Reviewed prior external information including notes and imaging from  primary care provider As well as notes that were available from care everywhere and other healthcare systems.  Past medical history, social, surgical and family history all reviewed in electronic medical record.  No pertanent information unless stated regarding to the chief complaint.   Review of Systems:  No headache, visual changes, nausea, vomiting, diarrhea, constipation, dizziness, abdominal pain, skin rash, fevers, chills, night sweats, weight loss, swollen lymph nodes, chest pain, shortness of breath, mood changes. POSITIVE muscle aches, body aches, fatigue  Objective  Blood pressure 118/66, pulse 93, height 5\' 6"  (1.676 m), weight 159 lb (72.1 kg), SpO2 (!) 72%.   General: No apparent distress alert and oriented x3 mood and affect normal, dressed appropriately.  HEENT: Pupils equal, extraocular movements intact  Respiratory: Patient's speak in full sentences and does not appear short of breath  Significant peripheral edema noted.  Patient has 1+ dorsalis pedis pulses bilaterally.  Antalgic gait noted. Patient has multiple different arthritic changes and does even have some swelling of the synovitis    Impression and Recommendations:    The above documentation has been reviewed and is accurate and complete Judi Saa, DO

## 2022-10-14 ENCOUNTER — Ambulatory Visit (INDEPENDENT_AMBULATORY_CARE_PROVIDER_SITE_OTHER): Payer: MEDICARE | Admitting: Family Medicine

## 2022-10-14 ENCOUNTER — Other Ambulatory Visit: Payer: Self-pay

## 2022-10-14 ENCOUNTER — Encounter: Payer: Self-pay | Admitting: Family Medicine

## 2022-10-14 VITALS — BP 118/66 | HR 93 | Ht 66.0 in | Wt 159.0 lb

## 2022-10-14 DIAGNOSIS — R600 Localized edema: Secondary | ICD-10-CM

## 2022-10-14 DIAGNOSIS — M79642 Pain in left hand: Secondary | ICD-10-CM

## 2022-10-14 DIAGNOSIS — M1812 Unilateral primary osteoarthritis of first carpometacarpal joint, left hand: Secondary | ICD-10-CM | POA: Diagnosis not present

## 2022-10-14 DIAGNOSIS — I739 Peripheral vascular disease, unspecified: Secondary | ICD-10-CM

## 2022-10-14 DIAGNOSIS — C9110 Chronic lymphocytic leukemia of B-cell type not having achieved remission: Secondary | ICD-10-CM | POA: Diagnosis not present

## 2022-10-14 NOTE — Assessment & Plan Note (Signed)
Had been seeing specialist up on in the [place] before patient moved down here.  At this point we will have patient referred to vascular surgery for further evaluation and treatment

## 2022-10-14 NOTE — Assessment & Plan Note (Signed)
Following up with hand surgeon.  Will see but may need to consider surgical intervention.

## 2022-10-14 NOTE — Patient Instructions (Addendum)
See Dr Merlyn Lot Referral to vascular DHEA 50mg  daily for 4 weeks then 2 weeks off See you again in 3 months

## 2022-10-14 NOTE — Assessment & Plan Note (Signed)
I am significantly concerned that this is progressing at this time and is causing a lot of the discomfort and pain at the moment.  Discussed with patient to follow-up with the hematologist.  We discussed the importance of the iron infusions and will try to follow-up on the labs.  Patient also need to continue with some of the supplementations we are trying to do.  Continue to try to stay active where possible.  Increase activity slowly.  Follow-up with me again in 6 to 8 weeks otherwise.  Total time with patient 31 minutes with reviewing patient's history and labs

## 2022-11-11 ENCOUNTER — Telehealth: Payer: Self-pay | Admitting: Family Medicine

## 2022-11-11 NOTE — Telephone Encounter (Signed)
Sent patient MyChart message.

## 2022-11-11 NOTE — Telephone Encounter (Signed)
Patient called stating that her "back is out" and she will be flying in from the [employer]. She asked if Dr Katrinka Blazing could work her in Thursday afternoon.  Offered Dr Jean Rosenthal but she would prefer to see Dr Katrinka Blazing and said that he usually sees her if needed.

## 2022-11-12 NOTE — Progress Notes (Deleted)
    [facility] [address] [place] [address] Phone: [phone]   Assessment and Plan:     There are no diagnoses linked to this encounter.  ***   Pertinent previous records reviewed include ***   Follow Up: ***     Subjective:   I, Tanzie Rothschild, am serving as a Neurosurgeon for Doctor Richardean Sale  Chief Complaint: low back pain   HPI:   11/13/2022 Patient is a 82 year old female complaining of low back pain. Patient states   Relevant Historical Information: ***  Additional pertinent review of systems negative.   Current Outpatient Medications:    predniSONE (DELTASONE) 50 MG tablet, Take 1 tablet (50 mg total) by mouth daily., Disp: 5 tablet, Rfl: 0   Vitamin D, Ergocalciferol, (DRISDOL) 1.25 MG (50000 UNIT) CAPS capsule, TAKE 1 CAPSULE BY MOUTH EVERY 7 DAYS, Disp: 12 capsule, Rfl: 0   Objective:     There were no vitals filed for this visit.    There is no height or weight on file to calculate BMI.    Physical Exam:    ***   Electronically signed by:  Aleen Sells [facility] 1:53 PM 11/12/22

## 2022-11-13 ENCOUNTER — Ambulatory Visit (INDEPENDENT_AMBULATORY_CARE_PROVIDER_SITE_OTHER): Payer: MEDICARE | Admitting: Sports Medicine

## 2022-11-13 ENCOUNTER — Ambulatory Visit: Payer: PRIVATE HEALTH INSURANCE | Admitting: Sports Medicine

## 2022-11-13 ENCOUNTER — Ambulatory Visit (INDEPENDENT_AMBULATORY_CARE_PROVIDER_SITE_OTHER): Payer: MEDICARE

## 2022-11-13 VITALS — BP 122/82 | HR 75 | Ht 66.0 in | Wt 161.0 lb

## 2022-11-13 DIAGNOSIS — M533 Sacrococcygeal disorders, not elsewhere classified: Secondary | ICD-10-CM

## 2022-11-13 DIAGNOSIS — G8929 Other chronic pain: Secondary | ICD-10-CM | POA: Diagnosis not present

## 2022-11-13 DIAGNOSIS — M545 Low back pain, unspecified: Secondary | ICD-10-CM

## 2022-11-13 DIAGNOSIS — M25552 Pain in left hip: Secondary | ICD-10-CM

## 2022-11-13 MED ORDER — MELOXICAM 15 MG PO TABS: 15.0000 mg | ORAL_TABLET | Freq: Every day | ORAL | 0 refills | Status: DC

## 2022-11-13 NOTE — Progress Notes (Signed)
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  Likely flared due to patient's recent travels, walking in airports, prolonged sitting - X-rays obtained in clinic.  My interpretation: No acute fracture or dislocation.  Degenerative changes in lumbar spine including degenerative disc disease, facet arthropathy most significant at L4-L5 and L5-S1.  SI joint sclerosis and spurring.  Mild intra-articular hip degenerative changes bilaterally - Start meloxicam 15 mg daily x2 weeks.  If still having pain after 2 weeks, complete 3rd-week of meloxicam. May use remaining meloxicam as needed once daily for pain control.  Do not to use additional NSAIDs while taking meloxicam.  May use Tylenol [phone] mg 2 to 3 times a day for breakthrough pain. -Start HEP for low back and sacrum - Recommend low impact exercises such as stationary bike, elliptical, water aerobics  15 additional minutes spent for educating Therapeutic Home Exercise Program.  This included exercises focusing on stretching, strengthening, with focus on eccentric aspects.   Long term goals include an improvement in range of motion, strength, endurance as well as avoiding reinjury. Patient's frequency would include in 1-2 times a day, 3-5 times a week for a duration of 6-12 weeks. Proper technique shown and discussed handout in great detail with ATC.  All questions were discussed and answered.     Pertinent previous records reviewed include none   Follow Up: 4-week follow-up as already scheduled with Dr. Katrinka Blazing.  Patient may follow-up with Dr. Katrinka Blazing or sooner with  myself if symptoms do not improve.  Could consider OMT versus SI joint CSI based on presentation   Subjective:   I, Moenique Parris, am serving as a Neurosurgeon for Doctor [employer]  Chief Complaint: back pain   HPI:   07/15/2022 Injection given and tolerated the procedure well, discussed icing regimen and home exercises, discussed which activities to do and which ones to avoid.  If worsening pain advanced imaging would be warranted with patient's history of CLL and melanoma.  Sedimentation rate ordered today as well.  Home exercises given.  Follow-up again in 6 to 8 weeks      Continues to have difficulty.  Patient would like anemia checked especially for iron levels.      Still present the patient is able to lift conservative therapy.  Will continue to monitor.      Update 10/14/2022 WALLACE DIETSCH is a 82 y.o. female coming in with complaint of R shoulder and knee pain. Patient states L hand and wrist pain. Also shoulder and back pain. Wrist is doing the worst.  Has CMC arthritis.  Is going to see another provider for this and discussed the potential for surgical intervention.  Feels like having more discomfort in the legs.  Affecting daily activities as well.  Patient feels like she is continuing to have significant fatigue.  Being worked up for more of the CLL and the iron deficiency.  Has seen a new hematologist this week and is hoping for other treatment options.  Patient is just wondering what else we could consider at this point.   11/13/22 Patient states that her back just went out about 5 days  ago. SI joint pain. States she fell 3 weeks ago . Low back left hip . States she feels week in her thighs .   Relevant Historical Information: CLL  Additional pertinent review of systems negative.   Current Outpatient Medications:    meloxicam (MOBIC) 15 MG tablet, Take 1 tablet (15 mg total) by mouth daily., Disp: 30 tablet, Rfl: 0   Vitamin D, Ergocalciferol, (DRISDOL) 1.25 MG  (50000 UNIT) CAPS capsule, TAKE 1 CAPSULE BY MOUTH EVERY 7 DAYS, Disp: 12 capsule, Rfl: 0   Objective:     Vitals:   11/13/22 1523  BP: 122/82  Pulse: 75  SpO2: 99%  Weight: 161 lb (73 kg)  Height: 5\' 6"  (1.676 m)      Body mass index is 25.99 kg/m.    Physical Exam:    Gen: Appears well, nad, nontoxic and pleasant Psych: Alert and oriented, appropriate mood and affect Neuro: sensation intact, strength is 5/5 in upper and lower extremities, muscle tone wnl Skin: no susupicious lesions or rashes  Back - Normal skin, Spine with normal alignment and no deformity.   No tenderness to vertebral process palpation.   Lumbar paraspinous muscles are     tender, worse on left compared to right, and without spasm Mild TTP gluteal musculature on left Straight leg raise negative Trendelenberg negative Piriformis Test negative Gait normal Positive sphinx, TTP bilateral sacral base, worse on left   Electronically signed by:  Aleen Sells [facility] 4:19 PM 11/13/22

## 2022-11-13 NOTE — Patient Instructions (Addendum)
-   Start meloxicam 15 mg daily x2 weeks.  If still having pain after 2 weeks, complete 3rd-week of meloxicam. May use remaining meloxicam as needed once daily for pain control.  Do not to use additional NSAIDs while taking meloxicam.  May use Tylenol [phone] mg 2 to 3 times a day for breakthrough pain. Low back HEP  Recommend low impact exercises such as stationary bike, elliptical, and water aerobics Keep appointment with Dr. Katrinka Blazing or sooner with Dr. Jean Rosenthal

## 2022-11-24 ENCOUNTER — Other Ambulatory Visit: Payer: Self-pay

## 2022-11-24 DIAGNOSIS — I872 Venous insufficiency (chronic) (peripheral): Secondary | ICD-10-CM

## 2022-11-28 ENCOUNTER — Other Ambulatory Visit: Payer: Self-pay | Admitting: Family Medicine

## 2022-12-08 NOTE — Progress Notes (Unsigned)
[facility] OF [place]  ASSESSMENT / PLAN: Belinda Montgomery is a 82 y.o. 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  Venous duplex is significant for GSV and SSV reflux Recommend compression and elevation for symptomatic relief. Patient counseled against saphenous vein ablation given very mild symptoms. Follow up as needed.  CHIEF COMPLAINT: Leg swelling  HISTORY OF PRESENT ILLNESS: Belinda Montgomery is a 82 y.o. female referred to clinic for evaluation of leg swelling.  Patient reports mild swelling in her lower extremities which is mildly bothersome to her.  She reports electric/burning discomfort in her bilateral lower extremities which occurs occasionally.  She has established diagnoses of degenerative disc disease.  We reviewed her duplex in detail.  No past medical history on file.  No past surgical history on file.  No family history on file.  Social History   Socioeconomic History   Marital status: Married    Spouse name: Not on file   Number of children: Not on file   Years of education: Not on file   Highest education level: Not on file  Occupational History   Not on file  Tobacco Use   Smoking status: Former   Smokeless tobacco: Never  Vaping Use   Vaping status: Never Used  Substance and Sexual Activity   Alcohol use: Yes    Alcohol/week: 2.0 standard drinks of alcohol    Types: 2 Glasses of wine per week   Drug use: Never   Sexual activity: Not on file  Other Topics Concern   Not on file  Social History Narrative   Not on file   Social Determinants of Health   Financial Resource Strain: Medium Risk (08/12/2019)   [facility] prior to 03/15/2022., [facility] visits prior to 03/15/2022.   Overall Financial Resource Strain (CARDIA)    Difficulty of Paying Living Expenses: Somewhat hard  Food Insecurity: Low Risk   (08/05/2022)   [facility]   Hunger Vital Sign    Worried About Running Out of Food in the Last Year: Never true    Ran Out of Food in the Last Year: Never true  Transportation Needs: Not on file (08/05/2022)  Physical Activity: Insufficiently Active (08/12/2019)   Received from [facility] visits prior to 03/15/2022., [facility] visits prior to 03/15/2022.   Exercise Vital Sign    Days of Exercise per Week: 3 days    Minutes of Exercise per Session: 20 min  Stress: No Stress Concern Present (08/12/2019)   [facility] prior to 03/15/2022., [facility] visits prior to 03/15/2022.   [name]    Feeling of Stress : Only a little  Social Connections: Moderately Integrated (08/12/2019)   Received from [facility] visits prior to 03/15/2022., [facility] visits prior to 03/15/2022.   Social Advertising account executive [NHANES]    Frequency of Communication with Friends and Family: More than three times a week    Frequency of Social Gatherings with Friends and Family: Once a week    Attends Religious Services: More than 4 times per year    Active Member of [employer] or Organizations: Yes    Attends Banker Meetings: More than 4 times per year    Marital  Status: Divorced  Intimate Partner Violence: Not At Risk (08/12/2019)   Received from [facility] visits prior to 03/15/2022., [facility] visits prior to 03/15/2022.   Humiliation, Afraid, Rape, and Kick questionnaire    Fear of Current or Ex-Partner: No    Emotionally Abused: No    Physically Abused: No    Sexually Abused: No    No Known Allergies  Current Outpatient Medications  Medication Sig Dispense Refill   Vitamin D, Ergocalciferol, (DRISDOL) 1.25 MG (50000 UNIT)  CAPS capsule TAKE 1 CAPSULE BY MOUTH EVERY 7 DAYS 12 capsule 0   No current facility-administered medications for this visit.    PHYSICAL EXAM Vitals:   12/09/22 1133  BP: 128/72  Pulse: 69  Resp: 20  Temp: 98.1 F (36.7 C)  SpO2: 95%  Weight: 160 lb (72.6 kg)  Height: 5\' 6"  (1.676 m)   Elderly woman in no distress Regular rate and rhythm Unlabored breathing Palpable dorsalis pedis pulses bilaterally Reticular veins about the medial legs Trace edema about the calves and ankles   PERTINENT LABORATORY AND RADIOLOGIC DATA Left:  - No evidence of deep vein thrombosis seen in the left lower extremity,  from the common femoral through the popliteal veins.  - No evidence of superficial venous thrombosis in the left lower  extremity.    - Venous reflux is noted in the left sapheno-femoral junction.  - Venous reflux is noted in the left greater saphenous vein in the thigh.  - Venous reflux is noted in the left greater saphenous vein in the calf.  - Venous reflux is noted in the left short saphenous vein.   - Complex heterogenous cystic nonvascular structure in the popliteal fossa  measuring approximately 5.4 x 1.8 x 4.4cm. 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   Rande Brunt. Lenell Antu, MD FACS [facility] Phone Number: [phone] 12/09/2022 1:32 PM   Total time spent on preparing this encounter including chart review, data review, collecting history, examining the patient, coordinating care for this new patient, 45 minutes.  Portions of this report may have been transcribed using voice recognition software.  Every effort has been made to ensure accuracy; however, inadvertent computerized transcription errors may still be present.

## 2022-12-09 ENCOUNTER — Ambulatory Visit (INDEPENDENT_AMBULATORY_CARE_PROVIDER_SITE_OTHER): Payer: MEDICARE | Admitting: Vascular Surgery

## 2022-12-09 ENCOUNTER — Ambulatory Visit (HOSPITAL_COMMUNITY)
Admission: RE | Admit: 2022-12-09 | Discharge: 2022-12-09 | Disposition: A | Discharge status: Home / Self Care | Payer: MEDICARE | Source: Ambulatory Visit | Attending: Vascular Surgery | Admitting: Vascular Surgery

## 2022-12-09 ENCOUNTER — Encounter: Payer: Self-pay | Admitting: Vascular Surgery

## 2022-12-09 VITALS — BP 128/72 | HR 69 | Temp 98.1°F | Resp 20 | Ht 66.0 in | Wt 160.0 lb

## 2022-12-09 DIAGNOSIS — I872 Venous insufficiency (chronic) (peripheral): Secondary | ICD-10-CM

## 2022-12-16 ENCOUNTER — Ambulatory Visit (INDEPENDENT_AMBULATORY_CARE_PROVIDER_SITE_OTHER): Payer: MEDICARE | Admitting: Family Medicine

## 2022-12-16 ENCOUNTER — Encounter: Payer: Self-pay | Admitting: Family Medicine

## 2022-12-16 VITALS — BP 128/88 | HR 71 | Ht 66.0 in

## 2022-12-16 DIAGNOSIS — M545 Low back pain, unspecified: Secondary | ICD-10-CM | POA: Diagnosis not present

## 2022-12-16 DIAGNOSIS — G8929 Other chronic pain: Secondary | ICD-10-CM

## 2022-12-16 DIAGNOSIS — M5126 Other intervertebral disc displacement, lumbar region: Secondary | ICD-10-CM

## 2022-12-16 DIAGNOSIS — M1812 Unilateral primary osteoarthritis of first carpometacarpal joint, left hand: Secondary | ICD-10-CM

## 2022-12-16 NOTE — Patient Instructions (Addendum)
Injection in wrist today [facility] [phone] Call Today  When we receive your results we will contact you. You have 14 days to return or exchange your brace Call [phone], then return the brace to our office See you again 12 weeks

## 2022-12-16 NOTE — Assessment & Plan Note (Addendum)
Given injection today, patient is following up with orthopedic surgery early next year and will consider the possibility of replacement.  Follow-up again in 6 to 8 weeks otherwise.  Nighttime thumb spica splint given today as well.

## 2022-12-16 NOTE — Assessment & Plan Note (Signed)
Past medical history for degenerative disc disease and laminectomy.  Very concerned with patient's symptoms with radicular symptoms at this moment that is affecting daily activities as well as patient has been able to be active for the possibility of adjacent segment disease.  He may need to be a candidate though for possible epidurals.  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  Sclerotic changes are noted at L2-L3 that are new.  Patient had an MRI of the back done and then we will discuss further at follow-up.

## 2022-12-16 NOTE — Progress Notes (Signed)
[name] [address] [place] [address] Phone: [phone] Subjective:   Bruce Donath, am serving as a scribe for Dr. Antoine Primas.  I'm seeing this patient by the request  of:  Day, Pernell Dupre, MD  CC: Left and SI joint pain  [id number]  10/14/2022 Had been seeing specialist up on in the [place] before patient moved down here.  At this point we will have patient referred to vascular surgery for further evaluation and treatment     Following up with hand surgeon.  Will see but may need to consider surgical intervention.     I am significantly concerned that this is progressing at this time and is causing a lot of the discomfort and pain at the moment.  Discussed with patient to follow-up with the hematologist.  We discussed the importance of the iron infusions and will try to follow-up on the labs.  Patient also need to continue with some of the supplementations we are trying to do.  Continue to try to stay active where possible.  Increase activity slowly.  Follow-up with me again in 6 to 8 weeks otherwise.  Total time with patient 31 minutes with reviewing patient's history and labs        Update 12/16/2022 Belinda Montgomery is a 82 y.o. female coming in with complaint of L thumb and SI joint pain. Patient states that her thumb is not doing well. Had injection one month ago by Dr. Merlyn Lot.   Back and hip pain have not improved either. Would like to go over xrays from 10/31.        No past medical history on file. No past surgical history on file. Social History   Socioeconomic History   Marital status: Married    Spouse name: Not on file   Number of children: Not on file   Years of education: Not on file   Highest education level: Not on file  Occupational History   Not on file  Tobacco Use   Smoking status: Former   Smokeless tobacco: Never  Vaping Use   Vaping status: Never Used  Substance and Sexual Activity    Alcohol use: Yes    Alcohol/week: 2.0 standard drinks of alcohol    Types: 2 Glasses of wine per week   Drug use: Never   Sexual activity: Not on file  Other Topics Concern   Not on file  Social History Narrative   Not on file   Social Determinants of Health   Financial Resource Strain: Medium Risk (08/12/2019)   [facility] prior to 03/15/2022., [facility] visits prior to 03/15/2022.   Overall Financial Resource Strain (CARDIA)    Difficulty of Paying Living Expenses: Somewhat hard  Food Insecurity: Low Risk  (08/05/2022)   [facility]   Hunger Vital Sign    Worried About Running Out of Food in the Last Year: Never true    Ran Out of Food in the Last Year: Never true  Transportation Needs: Not on file (08/05/2022)  Physical Activity: Insufficiently Active (08/12/2019)   Received from [facility] visits prior to 03/15/2022., [facility] visits prior to 03/15/2022.   Exercise Vital Sign    Days of Exercise per Week: 3 days    Minutes of Exercise per Session: 20 min  Stress: No Stress Concern Present (08/12/2019)   [facility] visits prior to 03/15/2022., [facility] visits prior to 03/15/2022.   [name]    Feeling of Stress : Only a little  Social Connections: Moderately Integrated (08/12/2019)   Received from [facility] visits prior to 03/15/2022., [facility] visits prior to 03/15/2022.   Social Advertising account executive [NHANES]    Frequency of Communication with Friends and Family: More than three times a week    Frequency of Social Gatherings with Friends and Family: Once a week    Attends Religious Services: More than 4 times per year    Active Member of [employer] or Organizations: Yes    Attends Probation officer: More than 4 times per year    Marital Status: Divorced   No Known Allergies No family history on file.       Current Outpatient Medications (Other):    Vitamin D, Ergocalciferol, (DRISDOL) 1.25 MG (50000 UNIT) CAPS capsule, TAKE 1 CAPSULE BY MOUTH EVERY 7 DAYS   Reviewed prior external information including notes and imaging from  primary care provider As well as notes that were available from care everywhere and other healthcare systems.  Past medical history, social, surgical and family history all reviewed in electronic medical record.  No pertanent information unless stated regarding to the chief complaint.   Review of Systems:  No headache, visual changes, nausea, vomiting, diarrhea, constipation, dizziness, abdominal pain, skin rash, fevers, chills, night sweats, weight loss, swollen lymph nodes, body aches, joint swelling, chest pain, shortness of breath, mood changes. POSITIVE muscle aches  Objective  Blood pressure 128/88, pulse 71, height 5\' 6"  (1.676 m), SpO2 98%.   General: No apparent distress alert and oriented x3 mood and affect normal, dressed appropriately.  HEENT: Pupils equal, extraocular movements intact  Respiratory: Patient's speak in full sentences and does not appear short of breath  Cardiovascular: No lower extremity edema, non tender, no erythema  Back exam does have some mild loss lordosis.  Arthritic changes noted of the [facility] joint with a positive grind test noted.  Mild thenar eminence wasting noted. Low back has significant loss of lordosis.  Difficulty going from a seated to standing position.  Patient does have radicular symptoms in the L5 distribution bilaterally with 3 out of 5 strength noted with dorsiflexion.  After verbal consent prepped with alcohol swab and with a 25-gauge 1 inch needle injected into the [facility] joint with a total of 0.5 cc of 0.5% Marcaine and 0.5 cc of Kenalog 40 mg/mL.  No blood loss.  Band-Aid placed.   Postinjection instructions given.    Impression and Recommendations:     The above documentation has been reviewed and is accurate and complete Judi Saa, DO

## 2023-01-05 ENCOUNTER — Ambulatory Visit
Admission: RE | Admit: 2023-01-05 | Discharge: 2023-01-05 | Disposition: A | Discharge status: Home / Self Care | Payer: MEDICARE | Source: Ambulatory Visit | Attending: Family Medicine | Admitting: Family Medicine

## 2023-01-05 ENCOUNTER — Encounter: Payer: Self-pay | Admitting: Family Medicine

## 2023-01-05 DIAGNOSIS — G8929 Other chronic pain: Secondary | ICD-10-CM

## 2023-01-21 ENCOUNTER — Encounter: Payer: Self-pay | Admitting: Family Medicine

## 2023-01-21 ENCOUNTER — Other Ambulatory Visit: Payer: Self-pay

## 2023-01-21 DIAGNOSIS — M5416 Radiculopathy, lumbar region: Secondary | ICD-10-CM

## 2023-01-26 ENCOUNTER — Encounter: Payer: Self-pay | Admitting: Family Medicine

## 2023-01-28 NOTE — Progress Notes (Signed)
[name] [address] [place] [address] Phone: [phone] Subjective:   [name], am serving as a scribe for Dr. Antoine Primas.  I'm seeing this patient by the request  of:  Day, Pernell Dupre, MD  CC: Left thumb pain, fatigue  [id number]  12/16/2022 Past medical history for degenerative disc disease and laminectomy.  Very concerned with patient's symptoms with radicular symptoms at this moment that is affecting daily activities as well as patient has been able to be active for the possibility of adjacent segment disease.  He may need to be a candidate though for possible epidurals.  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  Sclerotic changes are noted at L2-L3 that are new.  Patient had an MRI of the back done and then we will discuss further at follow-up.     Given injection today, patient is following up with orthopedic surgery early next year and will consider the possibility of replacement. Follow-up again in 6 to 8 weeks otherwise. Nighttime thumb spica splint given today as well.   Update 01/29/2023 Belinda Montgomery is a 83 y.o. female coming in with complaint of lumbar spine and L thumb pain. Patient states getting epidural tomorrow. Thumb not doing well.  Patient had labs done on January of this year.  Iron was now within normal range with a ferritin of 108   Patient did have her white blood cell count checked again and did show significant elevation with absolute lymphocytes increasing since October.  Likely hemoglobin is stable at 12.5.  Patient seen hematology and oncology.  And labs do appear stable.  Was to follow-up with them again in 6 months.  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  This likely corresponds with the weakness we were noticing.  She is scheduled for an  epidural tomorrow No past medical history on file. No past surgical history on file. Social History   Socioeconomic History   Marital status: Married    Spouse name: Not on file   Number of children: Not on file   Years of education: Not on file   Highest education level: Not on file  Occupational History   Not on file  Tobacco Use   Smoking status: Former   Smokeless tobacco: Never  Vaping Use   Vaping status: Never Used  Substance and Sexual Activity   Alcohol use: Yes    Alcohol/week: 2.0 standard drinks of alcohol    Types: 2 Glasses of wine per week   Drug use: Never   Sexual activity: Not on file  Other Topics Concern   Not on file  Social History Narrative   Not on file   Social Drivers of Health   Financial Resource Strain: Medium Risk (08/12/2019)   [facility] prior to 03/15/2022., [facility] visits prior to 03/15/2022.   Overall Financial Resource Strain (CARDIA)    Difficulty of Paying Living Expenses: Somewhat hard  Food Insecurity: Low Risk  (08/05/2022)   [facility]   Hunger Vital Sign    Worried About Running Out of Food in the Last Year: Never true    Ran Out of Food in the Last Year: Never true  Transportation Needs: Not on file (08/05/2022)  Physical Activity: Insufficiently  Active (08/12/2019)   [facility] visits prior to 03/15/2022., [facility] visits prior to 03/15/2022.   Exercise Vital Sign    Days of Exercise per Week: 3 days    Minutes of Exercise per Session: 20 min  Stress: No Stress Concern Present (08/12/2019)   [facility] visits prior to 03/15/2022., [facility] visits prior to 03/15/2022.   [name]    Feeling of Stress : Only a little  Social Connections: Moderately Integrated  (08/12/2019)   [facility] from [facility] visits prior to 03/15/2022., [facility] visits prior to 03/15/2022.   Social Advertising account executive [NHANES]    Frequency of Communication with Friends and Family: More than three times a week    Frequency of Social Gatherings with Friends and Family: Once a week    Attends Religious Services: More than 4 times per year    Active Member of [employer] or Organizations: Yes    Attends Engineer, structural: More than 4 times per year    Marital Status: Divorced   No Known Allergies No family history on file.       Current Outpatient Medications (Other):    Vitamin D, Ergocalciferol, (DRISDOL) 1.25 MG (50000 UNIT) CAPS capsule, TAKE 1 CAPSULE BY MOUTH EVERY 7 DAYS   Reviewed prior external information including notes and imaging from  primary care provider As well as notes that were available from care everywhere and other healthcare systems.  Past medical history, social, surgical and family history all reviewed in electronic medical record.  No pertanent information unless stated regarding to the chief complaint.   Review of Systems:  No headache, visual changes, nausea, vomiting, diarrhea, constipation, dizziness, abdominal pain, skin rash, fevers, chills, night sweats, weight loss, swollen lymph nodes, chest pain, shortness of breath, mood changes. POSITIVE muscle aches, joint swelling  Objective  Blood pressure 106/60, pulse 77, height 5\' 6"  (1.676 m), weight 159 lb (72.1 kg), SpO2 96%.   General: No apparent distress alert and oriented x3 mood and affect normal, dressed appropriately.  HEENT: Pupils equal, extraocular movements intact  Respiratory: Patient's speak in full sentences and does not appear short of breath  Cardiovascular: No lower extremity edema, non tender, no erythema  Low back exam shows loss of lordosis noted.  Negative Spurling's noted.  Patient does have some  tightness in the hamstring but nothing severe.  Uncomfortable with sitting for long amount of time.  Left thumb does have some atrophy noted.  Tender to palpation with positive grind test noted.  Limited muscular skeletal ultrasound was performed and interpreted by Antoine Primas, M  Limited ultrasound shows hypoechoic changes in the [facility] joint.  Moderate narrowing of the [facility] joint noted.  Osteopenia noted. Impression: Severe arthritis of    Impression and Recommendations:    The above documentation has been reviewed and is accurate and complete Judi Saa, DO

## 2023-01-29 ENCOUNTER — Ambulatory Visit (INDEPENDENT_AMBULATORY_CARE_PROVIDER_SITE_OTHER): Payer: MEDICARE | Admitting: Family Medicine

## 2023-01-29 ENCOUNTER — Encounter: Payer: Self-pay | Admitting: Family Medicine

## 2023-01-29 ENCOUNTER — Other Ambulatory Visit: Payer: Self-pay

## 2023-01-29 VITALS — BP 106/60 | HR 77 | Ht 66.0 in | Wt 159.0 lb

## 2023-01-29 DIAGNOSIS — M1812 Unilateral primary osteoarthritis of first carpometacarpal joint, left hand: Secondary | ICD-10-CM | POA: Diagnosis not present

## 2023-01-29 DIAGNOSIS — M5126 Other intervertebral disc displacement, lumbar region: Secondary | ICD-10-CM

## 2023-01-29 DIAGNOSIS — D508 Other iron deficiency anemias: Secondary | ICD-10-CM

## 2023-01-29 DIAGNOSIS — C9110 Chronic lymphocytic leukemia of B-cell type not having achieved remission: Secondary | ICD-10-CM | POA: Diagnosis not present

## 2023-01-29 DIAGNOSIS — M79642 Pain in left hand: Secondary | ICD-10-CM | POA: Diagnosis not present

## 2023-01-29 NOTE — Assessment & Plan Note (Signed)
The thumb.  Is following up with the hand surgeon in the near future.  We discussed the only thing else that we have available would be PRP and this is experimental.  She would like to talk to the hand surgeon to know what it would entail and what the-opinion of.  Follow-up with me again in 6 weeks to discuss any other surgical intervention if she would like.

## 2023-01-29 NOTE — Assessment & Plan Note (Signed)
Awaiting nerve root injection that is scheduled for tomorrow.

## 2023-01-29 NOTE — Patient Instructions (Addendum)
Read on PRP See you in Feb

## 2023-01-29 NOTE — Assessment & Plan Note (Signed)
Do believe the patient's CLL has contributed to some of the iron deficiency.  Likely patient's iron has stayed relatively well.  We did go over her studies that did show that it is slowly seeming to worsen again.  There is a chance that she may need infusion but do not think it is necessary at this point.  Would consider repeating labs in 1 in 2 months instead of 6 months and do them more every 3 to 6 months.  Patient would like to consider that.  Follow-up with me again in 2 months otherwise.

## 2023-01-29 NOTE — Discharge Instructions (Signed)

## 2023-01-30 ENCOUNTER — Ambulatory Visit
Admission: RE | Admit: 2023-01-30 | Discharge: 2023-01-30 | Disposition: A | Discharge status: Home / Self Care | Payer: MEDICARE | Source: Ambulatory Visit | Attending: Family Medicine | Admitting: Family Medicine

## 2023-01-30 DIAGNOSIS — M5416 Radiculopathy, lumbar region: Secondary | ICD-10-CM

## 2023-01-30 MED ORDER — IOPAMIDOL (ISOVUE-M 200) INJECTION 41%
1.0000 mL | Freq: Once | INTRAMUSCULAR | Status: AC
  Administered 2023-01-30: 1 mL via EPIDURAL

## 2023-01-30 MED ORDER — METHYLPREDNISOLONE ACETATE 40 MG/ML INJ SUSP (RADIOLOG
80.0000 mg | Freq: Once | INTRAMUSCULAR | Status: AC
Start: 2023-01-30 — End: 2023-01-30
  Administered 2023-01-30: 80 mg via EPIDURAL

## 2023-02-25 NOTE — Progress Notes (Signed)
 [name] [address] [place] [address] Phone: [phone] Subjective:   Belinda Montgomery, am serving as a scribe for Dr. Antoine Primas.  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  Likely patient's iron has stayed relatively well.  We did go over her studies that did show that it is slowly seeming to worsen again.  There is a chance that she may need infusion but do not think it is necessary at this point.  Would consider repeating labs in 1 in 2 months instead of 6 months and do them more every 3 to 6 months.  Patient would like to consider that.  Follow-up with me again in 2 months otherwise.   The thumb.  Is following up with the hand surgeon in the near future.  We discussed the only thing else that we have available would be PRP and this is experimental.  She would like to talk to the hand surgeon to know what it would entail and what the-opinion of.  Follow-up with me again in 6 weeks to discuss any other surgical intervention if she would like.      Update 03/04/2023 Belinda Montgomery is a 83 y.o. female coming in with complaint of L CMC joint pain.  Patient is also following up for low back pain.  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  Patient on January 17 was given an epidural at the L3-L4 side left.  Patient states back is doing much better. Able to more things. L wrist pain. Want to talk with you about PRP.  Patient did see a hand surgeon since we have last seen her and they did discuss potential PRP.    No past medical history on file. No past surgical history on file. Social History   Socioeconomic History   Marital status: Married    Spouse name: Not on file   Number of children: Not on file   Years of education: Not on file    Highest education level: Not on file  Occupational History   Not on file  Tobacco Use   Smoking status: Former   Smokeless tobacco: Never  Vaping Use   Vaping status: Never Used  Substance and Sexual Activity   Alcohol use: Yes    Alcohol/week: 2.0 standard drinks of alcohol    Types: 2 Glasses of wine per week   Drug use: Never   Sexual activity: Not on file  Other Topics Concern   Not on file  Social History Narrative   Not on file   Social Drivers of Health   Financial Resource Strain: Medium Risk (08/12/2019)   [facility] prior to 03/15/2022., [facility] visits prior to 03/15/2022.   Overall Financial Resource Strain (CARDIA)    Difficulty of Paying Living Expenses: Somewhat hard  Food Insecurity: Low Risk  (02/23/2023)   [facility]   Hunger Vital Sign    Worried About Running Out of Food in the Last Year: Never true    Ran Out of Food in the Last Year: Never true  Transportation Needs: No Transportation Needs (02/23/2023)   Received from [facility]    In the past  12 months, has lack of reliable transportation kept you from medical appointments, meetings, work or from getting things needed for daily living? : No  Physical Activity: Insufficiently Active (08/12/2019)   Received from [facility] visits prior to 03/15/2022., [facility] visits prior to 03/15/2022.   Exercise Vital Sign    Days of Exercise per Week: 3 days    Minutes of Exercise per Session: 20 min  Stress: No Stress Concern Present (08/12/2019)   [facility] prior to 03/15/2022., [facility] visits prior to 03/15/2022.   [name]    Feeling of Stress : Only a little  Social Connections: Moderately Integrated (08/12/2019)   [facility] from [facility] prior to 03/15/2022., [facility] visits prior to 03/15/2022.   Social Advertising account executive [NHANES]    Frequency of Communication with Friends and Family: More than three times a week    Frequency of Social Gatherings with Friends and Family: Once a week    Attends Religious Services: More than 4 times per year    Active Member of [employer] or Organizations: Yes    Attends Engineer, structural: More than 4 times per year    Marital Status: Divorced   No Known Allergies No family history on file.       Current Outpatient Medications (Other):    Vitamin D, Ergocalciferol, (DRISDOL) 1.25 MG (50000 UNIT) CAPS capsule, TAKE 1 CAPSULE BY MOUTH EVERY 7 DAYS    Objective  Blood pressure 122/64, pulse 68, height 5\' 6"  (1.676 m), weight 153 lb (69.4 kg), SpO2 95%.   General: No apparent distress alert and oriented x3 mood and affect normal, dressed appropriately.  HEENT: Pupils equal, extraocular movements intact  Respiratory: Patient's speak in full sentences and does not appear short of breath  Cardiovascular: No lower extremity edema, non tender, no erythema  Low back exam shows does have some loss lordosis noted.  Some tenderness to palpation diffusely.  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

## 2023-03-04 ENCOUNTER — Ambulatory Visit (INDEPENDENT_AMBULATORY_CARE_PROVIDER_SITE_OTHER): Payer: MEDICARE | Admitting: Family Medicine

## 2023-03-04 ENCOUNTER — Encounter: Payer: Self-pay | Admitting: Family Medicine

## 2023-03-04 VITALS — BP 122/64 | HR 68 | Ht 66.0 in | Wt 153.0 lb

## 2023-03-04 DIAGNOSIS — M5126 Other intervertebral disc displacement, lumbar region: Secondary | ICD-10-CM

## 2023-03-04 DIAGNOSIS — M1812 Unilateral primary osteoarthritis of first carpometacarpal joint, left hand: Secondary | ICD-10-CM | POA: Diagnosis not present

## 2023-03-04 DIAGNOSIS — C9110 Chronic lymphocytic leukemia of B-cell type not having achieved remission: Secondary | ICD-10-CM | POA: Diagnosis not present

## 2023-03-04 NOTE — Assessment & Plan Note (Signed)
 Much better after the epidural.  Discussed can repeat up to 3 times in a 53-month.  With showing improvement.  Discussed icing regimen and home exercises otherwise.

## 2023-03-04 NOTE — Assessment & Plan Note (Signed)
 Continuing to follow-up with his hematologist.

## 2023-03-04 NOTE — Assessment & Plan Note (Signed)
 Discussed with patient at great length.  Known to have severe arthritic changes.  Has talked to a hand surgeon but would like to avoid surgery if possible.  We discussed the only other idea would be potentially PRP.  Patient would like to be set up for that in the near future.  We discussed potential complications as well.  Should do relatively well though.

## 2023-03-04 NOTE — Patient Instructions (Addendum)
 PRP 9am on Monday 03/09/2023

## 2023-03-05 NOTE — Progress Notes (Signed)
 [name] [address] [place] [address] Phone: [phone] Subjective:   Bruce Donath, am serving as a scribe for Dr. Antoine Primas.  I'm seeing this patient by the request  of:  Day, Pernell Dupre, MD  CC: Hand pain  [id number]  03/04/2023 Discussed with patient at great length.  Known to have severe arthritic changes.  Has talked to a hand surgeon but would like to avoid surgery if possible.  We discussed the only other idea would be potentially PRP.  Patient would like to be set up for that in the near future.  We discussed potential complications as well.  Should do relatively well though.     Update 03/09/2203 TOBIN CADIENTE is a 83 y.o. female coming in with complaint of L CMC jt. PRP today. Patient states that her pain is worsening. Throbbing at night.       No past medical history on file. No past surgical history on file. Social History   Socioeconomic History   Marital status: Married    Spouse name: Not on file   Number of children: Not on file   Years of education: Not on file   Highest education level: Not on file  Occupational History   Not on file  Tobacco Use   Smoking status: Former   Smokeless tobacco: Never  Vaping Use   Vaping status: Never Used  Substance and Sexual Activity   Alcohol use: Yes    Alcohol/week: 2.0 standard drinks of alcohol    Types: 2 Glasses of wine per week   Drug use: Never   Sexual activity: Not on file  Other Topics Concern   Not on file  Social History Narrative   Not on file   Social Drivers of Health   Financial Resource Strain: Medium Risk (08/12/2019)   [facility] prior to [facility]/15/2022., [facility] visits prior to 03/15/2022.   Overall Financial Resource Strain (CARDIA)    Difficulty of Paying Living Expenses: Somewhat hard  Food Insecurity: Low Risk  (02/23/2023)   [facility]   Hunger Vital  Sign    Worried About Running Out of Food in the Last Year: Never true    Ran Out of Food in the Last Year: Never true  Transportation Needs: No Transportation Needs (02/23/2023)   Received from [facility]    In the past 12 months, has lack of reliable transportation kept you from medical appointments, meetings, work or from getting things needed for daily living? : No  Physical Activity: Insufficiently Active (08/12/2019)   Received from [facility] visits prior to 03/15/2022., [facility] visits prior to 03/15/2022.   Exercise Vital Sign    Days of Exercise per Week: 3 days    Minutes of Exercise per Session: 20 min  Stress: No Stress Concern Present (08/12/2019)   [facility] prior to 03/15/2022., [facility] visits prior to 03/15/2022.   [name]    Feeling of Stress : Only a little  Social Connections: Moderately Integrated (08/12/2019)   Received from [facility] visits prior to 03/15/2022., [facility] visits prior to 03/15/2022.   Social Connection and Isolation Panel [NHANES]    Frequency of Communication with Friends and Family: More than  three times a week    Frequency of Social Gatherings with Friends and Family: Once a week    Attends Religious Services: More than 4 times per year    Active Member of [employer] or Organizations: Yes    Attends Engineer, structural: More than 4 times per year    Marital Status: Divorced   No Known Allergies No family history on file.       Current Outpatient Medications (Other):    gabapentin (NEURONTIN) 100 MG capsule, Take 2 capsules (200 mg total) by mouth at bedtime.   Vitamin D, Ergocalciferol, (DRISDOL) 1.25 MG (50000 UNIT) CAPS capsule, Take 1 capsule (50,000 Units total) by mouth every 7 (seven)  days.     Objective  Blood pressure (!) 110/58, pulse 63, height 5\' 6"  (1.676 m), weight 154 lb (69.9 kg), SpO2 98%.   General: No apparent distress alert and oriented x3 mood and affect normal, dressed appropriately.   Procedure: Real-time Ultrasound Guided Injection of left CMC joint Device: GE Logiq Q7 Ultrasound guided injection is preferred based studies that show increased duration, increased effect, greater accuracy, decreased procedural pain, increased response rate, and decreased cost with ultrasound guided versus blind injection.  Verbal informed consent obtained.  Time-out conducted.  Noted no overlying erythema, induration, or other signs of local infection.  Skin prepped in a sterile fashion.  Local anesthesia: Topical Ethyl chloride.  With sterile technique and under real time ultrasound guidance: With a 22-gauge 2 inch needle patient was injected with 1 cc of 0.5% Marcaine and then injected with 3 cc of PRP leukocyte poor.  Patient had this repeated in the PIP joints of the index finger and fourth finger as well.  Another total of 2 cc of PRP used. Pain immediately resolved suggesting accurate placement of the medication.  Advised to call if fevers/chills, erythema, induration, drainage, or persistent bleeding.  Images permanently stored   Impression: Technically successful ultrasound guided injection.    Impression and Recommendations:      The above documentation has been reviewed and is accurate and complete Judi Saa, DO

## 2023-03-09 ENCOUNTER — Ambulatory Visit: Payer: Self-pay | Admitting: Family Medicine

## 2023-03-09 ENCOUNTER — Other Ambulatory Visit: Payer: Self-pay

## 2023-03-09 ENCOUNTER — Encounter: Payer: Self-pay | Admitting: Family Medicine

## 2023-03-09 VITALS — BP 110/58 | HR 63 | Ht 66.0 in | Wt 154.0 lb

## 2023-03-09 DIAGNOSIS — M79645 Pain in left finger(s): Secondary | ICD-10-CM

## 2023-03-09 DIAGNOSIS — G8929 Other chronic pain: Secondary | ICD-10-CM

## 2023-03-09 DIAGNOSIS — M1812 Unilateral primary osteoarthritis of first carpometacarpal joint, left hand: Secondary | ICD-10-CM

## 2023-03-09 MED ORDER — GABAPENTIN 100 MG PO CAPS: 200.0000 mg | ORAL_CAPSULE | Freq: Every day | ORAL | 0 refills | Status: AC

## 2023-03-09 MED ORDER — VITAMIN D (ERGOCALCIFEROL) 1.25 MG (50000 UNIT) PO CAPS: 50000.0000 [IU] | ORAL_CAPSULE | ORAL | 0 refills | Status: DC

## 2023-03-09 NOTE — Assessment & Plan Note (Signed)
 Repeat injection given today, discussed post PRP care with athletic trainer.

## 2023-03-09 NOTE — Patient Instructions (Signed)
 No ice or IBU for 3 days Heat and Tylenol are ok See me again in 6 weeks

## 2023-04-17 NOTE — Progress Notes (Signed)
 [name] [address] [place] [address] Phone: [phone] Subjective:   Belinda Montgomery am a scribe for Dr. Katrinka Blazing.   I'm seeing this patient by the request  of:  Day, Belinda Dupre, MD  CC: Left thumb pain, right shoulder pain  [id number]  03/09/2023 Repeat injection given today, discussed post PRP care with athletic trainer.      Update 04/20/2023 Belinda Montgomery is a 83 y.o. female coming in with complaint of L thumb pain. Patient states that it is not good today. The whole hand is not good today.  Patient is having more pain in the thumb and shoulder.  Affecting daily activities. Both of these are affecting daily activities.  Patient is also having abdominal pain she states.  Did have a gallbladder removed 4 years ago.  Continues to have which she says is chalky stool with symptoms bile.  Continues to have pain that can be fairly affecting daily activities.  Past medical history is significant for CLL.    No past medical history on file. No past surgical history on file. Social History   Socioeconomic History   Marital status: Married    Spouse name: Not on file   Number of children: Not on file   Years of education: Not on file   Highest education level: Not on file  Occupational History   Not on file  Tobacco Use   Smoking status: Former   Smokeless tobacco: Never  Vaping Use   Vaping status: Never Used  Substance and Sexual Activity   Alcohol use: Yes    Alcohol/week: 2.0 standard drinks of alcohol    Types: 2 Glasses of wine per week   Drug use: Never   Sexual activity: Not on file  Other Topics Concern   Not on file  Social History Narrative   Not on file   Social Drivers of Health   Financial Resource Strain: Medium Risk (08/12/2019)   [facility] prior to 03/15/2022., [facility] visits prior to 03/15/2022.   Overall Financial Resource Strain  (CARDIA)    Difficulty of Paying Living Expenses: Somewhat hard  Food Insecurity: Low Risk  (02/23/2023)   [facility]   Hunger Vital Sign    Worried About Running Out of Food in the Last Year: Never true    Ran Out of Food in the Last Year: Never true  Transportation Needs: No Transportation Needs (02/23/2023)   Received from [facility]    In the past 12 months, has lack of reliable transportation kept you from medical appointments, meetings, work or from getting things needed for daily living? : No  Physical Activity: Insufficiently Active (08/12/2019)   [facility] from [facility] visits prior to 03/15/2022., [facility] visits prior to 03/15/2022.   Exercise Vital Sign    Days of Exercise per Week: 3 days    Minutes of Exercise per Session: 20 min  Stress: No Stress Concern Present (08/12/2019)   [facility] prior to 03/15/2022., [facility] visits prior to 03/15/2022.   [name]    Feeling of Stress : Only a little  Social Connections: Moderately Integrated (08/12/2019)   [facility] from [facility] visits prior to 03/15/2022., [facility] visits prior to  03/15/2022.   Social Advertising account executive [NHANES]    Frequency of Communication with Friends and Family: More than three times a week    Frequency of Social Gatherings with Friends and Family: Once a week    Attends Religious Services: More than 4 times per year    Active Member of [employer] or Organizations: Yes    Attends Engineer, structural: More than 4 times per year    Marital Status: Divorced   No Known Allergies No family history on file.       Current Outpatient Medications (Other):    gabapentin (NEURONTIN) 100 MG capsule, Take 2 capsules (200 mg total) by mouth at  bedtime.   Vitamin D, Ergocalciferol, (DRISDOL) 1.25 MG (50000 UNIT) CAPS capsule, Take 1 capsule (50,000 Units total) by mouth every 7 (seven) days.   Reviewed prior external information including notes and imaging from  primary care provider As well as notes that were available from care everywhere and other healthcare systems.  Past medical history, social, surgical and family history all reviewed in electronic medical record.  No pertanent information unless stated regarding to the chief complaint.   Review of Systems:  No headache, visual changes, nausea, vomiting, diarrhea, constipation, dizziness, skin rash, fevers, chills, night sweats, weight loss, swollen lymph nodes, body aches, joint swelling, chest pain, shortness of breath, mood changes. POSITIVE muscle aches, abdominal pain  Objective  Blood pressure 130/60, pulse 68, height 5\' 6"  (1.676 m), weight 151 lb (68.5 kg), SpO2 97%.   General: No apparent distress alert and oriented x3 mood and affect normal, dressed appropriately.  HEENT: Pupils equal, extraocular movements intact  Respiratory: Patient's speak in full sentences and does not appear short of breath  Cardiovascular: No lower extremity edema, non tender, no erythema  Left thumb does have some atrophy noted.  Tenderness to palpation in the [facility] joint noted. Right shoulder positive impingement, arthritic changes noted.  Patient does have pain with Neer and Hawkins.  Rotator cuff strength appears to be intact. On exam having abdominal pain.  After informed written and verbal consent, patient was seated on exam table. Right shoulder was prepped with alcohol swab and utilizing posterior approach, patient's right glenohumeral space was injected with 4:1  marcaine 0.5%: Kenalog 40mg /dL. Patient tolerated the procedure well without immediate complications.  After verbal consent patient was prepped with alcohol swab and with a 25-gauge half inch needle injected into the left [facility]  joint with a total of 0.5 cc of 0.5% Marcaine and 0.5 cc of Kenalog 40 mg/mL no blood loss.  Band-Aid placed.  Postinjection instructions given    Impression and Recommendations:     The above documentation has been reviewed and is accurate and complete Judi Saa, DO

## 2023-04-20 ENCOUNTER — Encounter: Payer: Self-pay | Admitting: Family Medicine

## 2023-04-20 ENCOUNTER — Ambulatory Visit: Payer: MEDICARE | Admitting: Family Medicine

## 2023-04-20 VITALS — BP 130/60 | HR 68 | Ht 66.0 in | Wt 151.0 lb

## 2023-04-20 DIAGNOSIS — K824 Cholesterolosis of gallbladder: Secondary | ICD-10-CM | POA: Diagnosis not present

## 2023-04-20 DIAGNOSIS — G8929 Other chronic pain: Secondary | ICD-10-CM | POA: Diagnosis not present

## 2023-04-20 DIAGNOSIS — M25511 Pain in right shoulder: Secondary | ICD-10-CM | POA: Diagnosis not present

## 2023-04-20 DIAGNOSIS — R109 Unspecified abdominal pain: Secondary | ICD-10-CM | POA: Diagnosis not present

## 2023-04-20 DIAGNOSIS — M1812 Unilateral primary osteoarthritis of first carpometacarpal joint, left hand: Secondary | ICD-10-CM | POA: Diagnosis not present

## 2023-04-20 NOTE — Patient Instructions (Addendum)
 Injected thumb and shoulder Referral to GI See me again in 2 months

## 2023-04-20 NOTE — Assessment & Plan Note (Signed)
 Responded well to the injection.  Discussed icing regimen and home exercises, discussed proper mechanics.  Do feel that there is some underlying arthritis of the [facility] joint that could be also potentially.  Follow-up again in 6 to 8 weeks otherwise.

## 2023-04-20 NOTE — Assessment & Plan Note (Signed)
 Chronic problem.  Patient is going to follow-up with surgeon and discussed the potential for replacement.  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  Discussed icing regimen and home exercises, discussed which activities to do and which ones to avoid.  Follow-up again in 6 to 8 weeks otherwise.

## 2023-04-20 NOTE — Assessment & Plan Note (Signed)
 Had surgery previously will refer to gastroenterology for further evaluation.

## 2023-05-12 ENCOUNTER — Encounter: Payer: Self-pay | Admitting: Gastroenterology

## 2023-06-12 NOTE — Progress Notes (Signed)
 [facility] [address] [place] [address] Phone: [phone] Subjective:   Belinda Montgomery am a scribe for Dr. Felipe Horton.  I'm seeing this patient by the request  of:  Day, Ruven Coy, MD  CC: Multiple joint complaints  [id number]  04/20/2023 Responded well to the injection.  Discussed icing regimen and home exercises, discussed proper mechanics.  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  Follow-up again in 6 to 8 weeks otherwise.     Had surgery previously will refer to gastroenterology for further evaluation.   Chronic problem.  Patient is going to follow-up with surgeon and discussed the potential for replacement.  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  Discussed icing regimen and home exercises, discussed which activities to do and which ones to avoid.  Follow-up again in 6 to 8 weeks otherwise.      Update 06/15/2023 BRENDALEE Montgomery is a 83 y.o. female coming in with complaint of L thumb and R shoulder pain. Patient states left thumb is pretty good. The right shoulder is ok, it isn't horrible. SI Joint lower left is more problematic but not horrible.       No past medical history on file. No past surgical history on file. Social History   Socioeconomic History   Marital status: Married    Spouse name: Not on file   Number of children: Not on file   Years of education: Not on file   Highest education level: Not on file  Occupational History   Not on file  Tobacco Use   Smoking status: Former   Smokeless tobacco: Never  Vaping Use   Vaping status: Never Used  Substance and Sexual Activity   Alcohol use: Yes    Alcohol/week: 2.0 standard drinks of alcohol    Types: 2 Glasses of wine per week   Drug use: Never   Sexual activity:  Not on file  Other Topics Concern   Not on file  Social History Narrative   Not on file   Social Drivers of Health   Financial Resource Strain: Medium Risk (08/12/2019)   Received from [facility] visits prior to 03/15/2022., [facility] visits prior to 03/15/2022.   Overall Financial Resource Strain (CARDIA)    Difficulty of Paying Living Expenses: Somewhat hard  Food Insecurity: Low Risk  (02/23/2023)   [facility]   Hunger Vital Sign    Worried About Running Out of Food in the Last Year: Never true    Ran Out of Food in the Last Year: Never true  Transportation Needs: No Transportation Needs (02/23/2023)   Received from [facility]    In the past 12 months, has lack of reliable transportation kept you from medical appointments, meetings, work or from getting things needed for daily living? : No  Physical Activity: Insufficiently Active (08/12/2019)   Received from [facility] visits prior to 03/15/2022., [facility] visits prior to 03/15/2022.   Exercise Vital Sign    Days of Exercise per Week: 3 days    Minutes of Exercise per Session: 20 min  Stress: No Stress Concern Present (08/12/2019)   Received from [facility] visits prior to 03/15/2022., [facility] visits prior to 03/15/2022.   [name]    Feeling of Stress : Only a little  Social Connections: Moderately Integrated (08/12/2019)   Received from [facility] visits prior to 03/15/2022., [facility] visits prior to 03/15/2022.   Social Advertising account executive [NHANES]    Frequency of Communication with Friends and Family: More than three times a week    Frequency of Social Gatherings with Friends and Family: Once a week    Attends Religious Services: More than 4  times per year    Active Member of [employer] or Organizations: Yes    Attends Engineer, structural: More than 4 times per year    Marital Status: Divorced   No Known Allergies No family history on file.       Current Outpatient Medications (Other):    gabapentin  (NEURONTIN ) 100 MG capsule, Take 2 capsules (200 mg total) by mouth at bedtime.   Vitamin D , Ergocalciferol , (DRISDOL ) 1.25 MG (50000 UNIT) CAPS capsule, Take 1 capsule (50,000 Units total) by mouth every 7 (seven) days.   Reviewed prior external information including notes and imaging from  primary care provider As well as notes that were available from care everywhere and other healthcare systems.  Past medical history, social, surgical and family history all reviewed in electronic medical record.  No pertanent information unless stated regarding to the chief complaint.   Review of Systems:  No headache, visual changes, nausea, vomiting, diarrhea, constipation, dizziness, abdominal pain, skin rash, fevers, chills, night sweats, weight loss, swollen lymph nodes, body aches, joint swelling, chest pain, shortness of breath, mood changes. POSITIVE muscle aches  Objective  Blood pressure 118/70, pulse 69, height 5\' 6"  (1.676 m), weight 149 lb (67.6 kg), SpO2 96%.   General: No apparent distress alert and oriented x3 mood and affect normal, dressed appropriately.  HEENT: Pupils equal, extraocular movements intact  Respiratory: Patient's speak in full sentences and does not appear short of breath  Cardiovascular: No lower extremity edema, non tender, no erythema  Patient does have arthritic changes of multiple joints.  Does have some crepitus noted in the left [facility] joint.  Positive grind test noted.  Low back exam does have some loss lordosis noted.  Some tenderness to palpation diffusely.  Able to get out from the chair without any significant difficulty but difficulty to straighten up all the way.  Does have 4 out of 5  strength of the lower extremities.    Impression and Recommendations:    The above documentation has been reviewed and is accurate and complete Kregg Cihlar M Gizelle Whetsel, DO

## 2023-06-15 ENCOUNTER — Other Ambulatory Visit: Payer: Self-pay

## 2023-06-15 ENCOUNTER — Ambulatory Visit (INDEPENDENT_AMBULATORY_CARE_PROVIDER_SITE_OTHER): Payer: MEDICARE | Admitting: Family Medicine

## 2023-06-15 VITALS — BP 118/70 | HR 69 | Ht 66.0 in | Wt 149.0 lb

## 2023-06-15 DIAGNOSIS — M5416 Radiculopathy, lumbar region: Secondary | ICD-10-CM

## 2023-06-15 DIAGNOSIS — M5126 Other intervertebral disc displacement, lumbar region: Secondary | ICD-10-CM

## 2023-06-15 DIAGNOSIS — M1812 Unilateral primary osteoarthritis of first carpometacarpal joint, left hand: Secondary | ICD-10-CM | POA: Diagnosis not present

## 2023-06-15 DIAGNOSIS — M79642 Pain in left hand: Secondary | ICD-10-CM

## 2023-06-15 MED ORDER — VITAMIN D (ERGOCALCIFEROL) 1.25 MG (50000 UNIT) PO CAPS: 50000.0000 [IU] | ORAL_CAPSULE | ORAL | 0 refills | Status: DC

## 2023-06-15 NOTE — Assessment & Plan Note (Signed)
 Patient has been doing very well overall but does need the epidurals from time to time to continue to be active.  Patient is also going to be the primary caregiver for her ailing husband.  Patient has as much energy as someone 15 years younger.  Encouraged her to continue to be active.  Has been doing a lot of gardening that could have exacerbated it.  Discussed with patient about icing regimen and home exercises.  Increase activity slowly.  Follow-up again in 6 to 8 weeks otherwise.

## 2023-06-15 NOTE — Assessment & Plan Note (Addendum)
 Continues to have arthritic changes.  We have discussed when to repeat the injections but try to space them out or possible.  Hopeful progression will make some difference.  Follow-up again in 6 to 8 weeks.  Continue bracing as needed time with patient discussing other treatment options and other ailments 31 minutes continue gabapentin  200 mg at night for her ailments

## 2023-06-15 NOTE — Addendum Note (Signed)
 Addended by: Leone Ralphs D on: 06/15/2023 02:11 PM   Modules accepted: Orders

## 2023-06-15 NOTE — Patient Instructions (Addendum)
 Good to see you! Repeat Epidural. They will call to schedule. Will keep up with the thumb. Follow up in 2 months.

## 2023-07-15 ENCOUNTER — Inpatient Hospital Stay
Admission: RE | Admit: 2023-07-15 | Discharge: 2023-07-15 | Disposition: A | Discharge status: Home / Self Care | Payer: MEDICARE | Source: Ambulatory Visit | Attending: Family Medicine | Admitting: Family Medicine

## 2023-07-15 DIAGNOSIS — M5416 Radiculopathy, lumbar region: Secondary | ICD-10-CM

## 2023-07-15 MED ORDER — METHYLPREDNISOLONE ACETATE 40 MG/ML INJ SUSP (RADIOLOG
80.0000 mg | Freq: Once | INTRAMUSCULAR | Status: AC
Start: 2023-07-15 — End: 2023-07-15
  Administered 2023-07-15: 80 mg via EPIDURAL

## 2023-07-15 MED ORDER — IOPAMIDOL (ISOVUE-M 200) INJECTION 41%
1.0000 mL | Freq: Once | INTRAMUSCULAR | Status: AC
Start: 2023-07-15 — End: 2023-07-15
  Administered 2023-07-15: 1 mL via EPIDURAL

## 2023-07-15 NOTE — Discharge Instructions (Signed)

## 2023-07-20 ENCOUNTER — Other Ambulatory Visit: Payer: Self-pay | Admitting: Family Medicine

## 2023-07-29 ENCOUNTER — Ambulatory Visit (INDEPENDENT_AMBULATORY_CARE_PROVIDER_SITE_OTHER): Payer: MEDICARE | Admitting: Gastroenterology

## 2023-07-29 ENCOUNTER — Encounter: Payer: Self-pay | Admitting: Gastroenterology

## 2023-07-29 ENCOUNTER — Other Ambulatory Visit: Payer: MEDICARE

## 2023-07-29 VITALS — BP 110/60 | HR 80 | Ht 65.5 in | Wt 147.2 lb

## 2023-07-29 DIAGNOSIS — Z9049 Acquired absence of other specified parts of digestive tract: Secondary | ICD-10-CM

## 2023-07-29 DIAGNOSIS — R634 Abnormal weight loss: Secondary | ICD-10-CM

## 2023-07-29 DIAGNOSIS — K529 Noninfective gastroenteritis and colitis, unspecified: Secondary | ICD-10-CM

## 2023-07-29 MED ORDER — CHOLESTYRAMINE 4 G PO PACK: 4.0000 g | PACK | Freq: Every day | ORAL | 2 refills | Status: AC

## 2023-07-29 NOTE — Progress Notes (Signed)
 GASTROENTEROLOGY OUTPATIENT CLINIC VISIT   Primary Care Provider Day, [name], MD No address on file None  Referring Provider Day, [name], MD No address on file   Patient Profile: Belinda Montgomery is a 83 y.o. female with a pmh significant for CLL, arthritis prior anxiety, MDD, prior babesiosis, Lyme disease, status postcholecystectomy, colon polyps, diverticulosis.  The patient presents to the [facility] for an evaluation and management of problem(s) noted below:  Problem List 1. Chronic diarrhea   2. History of cholecystectomy   3. Unintentional weight loss    Discussed the use of AI scribe software for clinical note transcription with the patient, who gave verbal consent to proceed.  History of Present Illness Belinda Montgomery is an 83 year old female who presents for transition of care and establishment with issues of chronic diarrhea.  She has been experiencing ongoing diarrhea with flare-ups that have persisted despite various treatments.  Her symptoms worsened following a cholecystectomy in 2021. She uses cholestyramine  on an as-needed basis but not daily.  Imodium is also used occasionally.  She has attempted dietary modifications, including the FODMAP diet, and underwent IgG testing, but these have not provided relief.  She reports a prior breath test for small intestine bacterial overgrowth (SIBO) conducted around 2020 or 2021 was negative (though I cannot see this in the record itself on CareEverywhere).  She has not had a stool test for fecal elastase to evaluate for exocrine pancreatic insufficiency.  She has tested negative for Celiac in the past and PCP recently checked her DGP, and it was negative.  Her bowel movements are typically normal for three to four days before experiencing a flare-up characterized by urgent diarrhea. During normal periods, she has one to two formed stools per day. During flare-ups, she experiences loose stools with yellow  bile.  Her medical history includes chronic lymphocytic leukemia since 2014, hypothyroidism, and Lyme disease in 2023, which was followed by severe arthritis pain and a CT scan showing mild splenomegaly. Her thyroid function is currently normal. No recent thyroid issues.  She has previously been seen by [facility] providers but did not return to their care.   GI Review of Systems Positive as above Negative for dysphagia, odynophagia, nausea, vomiting, melena, hematochezia  Review of Systems General: Positive for unintentional weight loss; denies fevers/chills Cardiovascular: Denies chest pain Pulmonary: Denies shortness of breath Gastroenterological: See HPI Genitourinary: Denies darkened urine Hematological: Denies easy bruising/bleeding Endocrine: Denies temperature intolerance Dermatological: Denies jaundice Psychological: Mood is stable  Medications Current Outpatient Medications  Medication Sig Dispense Refill   alendronate (FOSAMAX) 70 MG tablet Take 70 mg by mouth once a week.     cyanocobalamin (VITAMIN B12) 500 MCG tablet Take 500 mcg by mouth daily.     folic acid (FOLVITE) 1 MG tablet Take 1 mg by mouth daily.     gabapentin  (NEURONTIN ) 100 MG capsule Take 2 capsules (200 mg total) by mouth at bedtime. (Patient taking differently: Take 200 mg by mouth as needed.) 180 capsule 0   hydrocortisone 2.5 % ointment Apply 1 Application topically as needed.     loperamide (IMODIUM A-D) 2 MG tablet Take 2 mg by mouth as needed.     MAGNESIUM CITRATE PO Take 1 tablet by mouth daily.     Multiple Vitamin (MULTIVITAMIN PO) Take 1 capsule by mouth daily.     Vitamin D , Ergocalciferol , (DRISDOL ) 1.25 MG (50000 UNIT) CAPS capsule TAKE 1 CAPSULE BY MOUTH EVERY 7 DAYS 12  capsule 0   cholestyramine  (QUESTRAN ) 4 g packet Take 1 packet (4 g total) by mouth daily. 30 each 2   No current facility-administered medications for this visit.    Allergies No Known Allergies  Histories Past Medical  History:  Diagnosis Date   Anemia    Anxiety    Arthritis    Basal cell carcinoma    CLL (chronic lymphocytic leukemia) (HCC)    Colon polyps    Depression    Diverticulosis    HLD (hyperlipidemia)    IBS (irritable bowel syndrome)    Lyme disease 2023   Melanoma (HCC)    Pneumonia    Sleep apnea    Splenomegaly    Thyroid disease    Past Surgical History:  Procedure Laterality Date   CESAREAN SECTION  1973   CHOLECYSTECTOMY  2021   LUMBAR LAMINECTOMY  2016   NASAL SINUS SURGERY  1975   Social History   Socioeconomic History   Marital status: Married    Spouse name: Not on file   Number of children: 1   Years of education: Not on file   Highest education level: Not on file  Occupational History   Occupation: Fine Arts/Garden Estate agent  Tobacco Use   Smoking status: Former    Current packs/day: 0.00    Types: Cigarettes    Quit date: 2012    Years since quitting: 13.5   Smokeless tobacco: Never  Vaping Use   Vaping status: Never Used  Substance and Sexual Activity   Alcohol use: Yes    Alcohol/week: 2.0 standard drinks of alcohol    Types: 2 Glasses of wine per week    Comment: 0-.5 per day   Drug use: Never   Sexual activity: Not on file  Other Topics Concern   Not on file  Social History Narrative   Not on file   Social Drivers of Health   Financial Resource Strain: Medium Risk (08/12/2019)   [facility] visits prior to 03/15/2022.   Overall Financial Resource Strain (CARDIA)    Difficulty of Paying Living Expenses: Somewhat hard  Food Insecurity: Low Risk  (02/23/2023)   [facility]   Hunger Vital Sign    Within the past 12 months, you worried that your food would run out before you got money to buy more: Never true    Within the past 12 months, the food you bought just didn't last and you didn't have money to get more. : Never true  Transportation Needs: No Transportation Needs (02/23/2023)   Received  from [facility]    In the past 12 months, has lack of reliable transportation kept you from medical appointments, meetings, work or from getting things needed for daily living? : No  Physical Activity: Insufficiently Active (08/12/2019)   Received from [facility] visits prior to 03/15/2022.   Exercise Vital Sign    On average, how many days per week do you engage in moderate to strenuous exercise (like a brisk walk)?: 3 days    On average, how many minutes do you engage in exercise at this level?: 20 min  Stress: No Stress Concern Present (08/12/2019)   Received from [facility] visits prior to 03/15/2022.   [name]    Feeling of Stress : Only a little  Social Connections: Moderately Integrated (08/12/2019)   [facility] visits prior to 03/15/2022.   Social Connection and Isolation Panel    In a typical week, how many times do you talk on the phone with family, friends, or neighbors?: More than three times a week    How often do you get together with friends or relatives?: Once a week    How often do you attend church or religious services?: More than 4 times per year    Do you belong to any clubs or organizations such as church groups, unions, fraternal or athletic groups, or school groups?: Yes    How often do you attend meetings of the clubs or organizations you belong to?: More than 4 times per year    Are you married, widowed, divorced, separated, never married, or living with a partner?: Divorced  Intimate Partner Violence: Not At Risk (08/12/2019)   [facility] visits prior to 03/15/2022.   Humiliation, Afraid, Rape, and Kick questionnaire    Within the last year, have you been afraid of your partner or ex-partner?: No    Within the last year, have you been humiliated or emotionally abused in  other ways by your partner or ex-partner?: No    Within the last year, have you been kicked, hit, slapped, or otherwise physically hurt by your partner or ex-partner?: No    Within the last year, have you been raped or forced to have any kind of sexual activity by your partner or ex-partner?: No   Family History  Problem Relation Age of Onset   Hodgkin's lymphoma Mother    Colon polyps Father    Diverticulitis Father        colostomy   Skin cancer Sister    Cancer Sister        type unknown, stomach area   Skin cancer Brother    Skin cancer Brother        melanoma   Hodgkin's lymphoma Maternal Grandmother    Gallbladder disease Maternal Grandmother    Colon cancer Neg Hx    Esophageal cancer Neg Hx    Inflammatory bowel disease Neg Hx    Liver disease Neg Hx    Pancreatic cancer Neg Hx    Rectal cancer Neg Hx    Stomach cancer Neg Hx    I have reviewed her medical, social, and family history in detail and updated the electronic medical record as necessary.    PHYSICAL EXAMINATION  BP 110/60 (BP Location: Left Arm, Patient Position: Sitting, Cuff Size: Normal)   Pulse 80   Ht 5' 5.5 (1.664 m) Comment: height measured without shoes  Wt 147 lb 4 oz (66.8 kg)   BMI 24.13 kg/m  Wt Readings from Last 3 Encounters:  07/29/23 147 lb 4 oz (66.8 kg)  06/15/23 149 lb (67.6 kg)  04/20/23 151 lb (68.5 kg)  GEN: NAD, appears younger than stated age, doesn't appear chronically ill PSYCH: Cooperative, without pressured speech EYE: Conjunctivae pink, sclerae anicteric ENT: MMM CV: Nontachycardic RESP: No audible wheezing GI: NABS, soft, NT/ND, without rebound MSK/EXT: No significant lower extremity edema SKIN: No jaundice NEURO:  Alert & Oriented x 3, no focal deficits   REVIEW OF DATA  I reviewed the following data at the time of this encounter:  GI Procedures and Studies  2024 outside colonoscopy Findings Normal perianal and rectal exam Retroflexion with hypertrophic anal  papilla Normal colonic mucosa throughout. Sigmoid diverticula.  There were 3 subcentimeter polyps removed: 2 transverse  colon polyps w/ cold snare 1 sigmoid colon polyp w/ cold snare   Pathology Hyperplastic polyps  Possible breath test March 2021 cannot see results  Laboratory Studies  Reviewed those in epic and Care Everywhere  Imaging Studies  May 2024 CTAP outside FINDINGS:  . Lower Chest: Within normal limits.   . Liver: No suspicious focal findings.  . Gallbladder/Biliary: Cholecystectomy. Mild enlargement of the common bile duct, likely related to patient age and post cholecystectomy state.  SABRA Spleen: Mild splenomegaly measuring 13.8 cm in AP dimension.  . Pancreas: Unremarkable.  . Adrenals: Unremarkable.  . Kidneys: Symmetric renal enhancement, no hydronephrosis. Right extrarenal pelvis.   . Peritoneum/Mesenteries/Extraperitoneum: No free air. No free fluid or loculated drainable collection. No pathologically enlarged lymph nodes.  . Gastrointestinal tract: No evidence of obstruction.: Diverticula without diverticulitis.   . Ureters: Unremarkable.  . Bladder: Unremarkable.  . Reproductive System: Bulky calcified fibroid at the left aspect of the uterus.   . Vascular: Atherosclerotic changes of the abdominal aorta and branch vessels with ectasia of the infrarenal abdominal aorta with caliber measuring up to possibly 2.8 cm. Moderate narrowing of the bilateral renal artery origins.  . Musculoskeletal: No acute displaced fractures. No aggressive focal bony lesions. Multilevel degenerative changes of the spine, greatest at L3-L4 with left lateral listhesis of L3 on L4 and left hemilaminectomy at this level. Osteopenia.    ASSESSMENT  Ms. Afzal is a 83 y.o. female with a pmh significant for CLL, arthritis prior anxiety, MDD, prior babesiosis, Lyme disease, status postcholecystectomy, colon polyps, diverticulosis.  The patient is seen today for evaluation and management  of:  1. Chronic diarrhea   2. History of cholecystectomy   3. Unintentional weight loss    The patient is hemodynamically stable.  Clinically, she needs to experience issues with diarrhea-unclear etiology.  Postcholecystectomy she had worsening of her symptoms.  I have asked her to increase her cholestyramine  to her daily use if possible of 1/2 packet/day if she can tolerate this without getting constipated.  We are going to rule out EPI.  We will get the records from [facility] in regards to the GI workup and breath test that she has had previously to see if she may benefit from SIBO breath testing.  The rule out of microscopic/collagenous/lymphocytic colitis with endoscopic reevaluation should be considered if things continue to be an issue.  Will need to monitor her weight if it continues to be an issue endoscopic evaluation from above and below will be recommended.     PLAN  Obtain records of [facility] workup in the past Stool studies as outlined below Cholestyramine  1/2 packet daily if able to tolerate Imodium as needed Consider diagnostic colonoscopy if diarrheal symptoms persist If unintentional weight loss persists, will need to consider diagnostic evaluation from above and below SIBO breath testing will be considered after evaluation of patient's prior reported breath test   Orders Placed This Encounter  Procedures   Pancreatic elastase, fecal   Fecal fat, qualitative    New Prescriptions   No medications on file   Modified Medications   Modified Medication Previous Medication   CHOLESTYRAMINE  (QUESTRAN ) 4 G PACKET cholestyramine  (QUESTRAN ) 4 g packet      Take 1 packet (4 g total) by mouth daily.    Take 4 g by mouth as needed.    Planned Follow Up Return in about 4 months (around 11/29/2023).   Total Time in Face-to-Face and in Coordination of Care for patient including independent/personal  interpretation/review of prior testing, medical history, examination, medication  adjustment, communicating results with the patient directly, and documentation within the EHR is 45 minutes.   Aloha Finner, MD [facility] Office # [phone]

## 2023-07-29 NOTE — Patient Instructions (Addendum)
 We have sent the following medications to your pharmacy for you to pick up at your convenience: Cholestyramine  - Take 1/2 packet daily, may increase to full packet if needed.    Your provider has requested that you go to the basement level for lab work before leaving today. Press B on the elevator. The lab is located at the first door on the left as you exit the elevator.   After Dr Wilhelmenia further reviews your chart he will decide if further testing is needed. We will also request previous GI records.   _______________________________________________________  If your blood pressure at your visit was 140/90 or greater, please contact your primary care physician to follow up on this.  _______________________________________________________  If you are age 55 or older, your body mass index should be between 23-30. Your Body mass index is 24.13 kg/m. If this is out of the aforementioned range listed, please consider follow up with your Primary Care Provider.  If you are age 16 or younger, your body mass index should be between 19-25. Your Body mass index is 24.13 kg/m. If this is out of the aformentioned range listed, please consider follow up with your Primary Care Provider.   ________________________________________________________  The [facility] providers would like to encourage you to use MYCHART to communicate with providers for non-urgent requests or questions.  Due to long hold times on the telephone, sending your provider a message by [facility] may be a faster and more efficient way to get a response.  Please allow 48 business hours for a response.  Please remember that this is for non-urgent requests.  _______________________________________________________  Thank you for choosing me and [facility].  Dr. Wilhelmenia

## 2023-08-01 ENCOUNTER — Encounter: Payer: Self-pay | Admitting: Gastroenterology

## 2023-08-01 DIAGNOSIS — R634 Abnormal weight loss: Secondary | ICD-10-CM | POA: Insufficient documentation

## 2023-08-01 DIAGNOSIS — K529 Noninfective gastroenteritis and colitis, unspecified: Secondary | ICD-10-CM | POA: Insufficient documentation

## 2023-08-01 DIAGNOSIS — Z9049 Acquired absence of other specified parts of digestive tract: Secondary | ICD-10-CM | POA: Insufficient documentation

## 2023-08-07 ENCOUNTER — Other Ambulatory Visit: Payer: MEDICARE

## 2023-08-11 ENCOUNTER — Ambulatory Visit: Payer: Self-pay | Admitting: Gastroenterology

## 2023-08-11 LAB — FECAL FAT, QUALITATIVE
Fat Qual Neutral, Stl: NORMAL
Fat Qual Total, Stl: NORMAL

## 2023-08-12 ENCOUNTER — Encounter: Payer: Self-pay | Admitting: Gastroenterology

## 2023-08-12 NOTE — Progress Notes (Signed)
 August 2020 small bowel enteroscopy indication iron deficiency anemia LA grade a reflux esophagitis. 2 cm hiatal hernia. Normal stomach. Normal examined duodenum.  Biopsied. The examined portion of the jejunum was normal.  August 2020 colonoscopy indication iron deficiency anemia One 5 mm polyp in the transverse colon removed with cold snare.  Resected and retrieved. Diverticulosis in the sigmoid colon. Normal mucosa in the entire examined colon.  Biopsied.  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  While this is nonspecific finding and may be of little clinical significance and may be seen in some cases of celiac sprue.  Correlation with serologic data may be useful.  Random colon unremarkable colonic mucosa.  No lymphocytic colitis. Transverse colon polyp sessile serrated adenoma.  These results will be scanned into the chart

## 2023-08-13 LAB — PANCREATIC ELASTASE, FECAL: Pancreatic Elastase-1, Stool: >800 ug/g (ref >200)

## 2023-08-13 NOTE — Progress Notes (Unsigned)
 [facility] [address] [place] [address] Phone: [phone] Subjective:   Belinda Montgomery Belinda Montgomery, am serving as a scribe for Dr. Arthea Montgomery.  I'm seeing this patient by the request  of:  Belinda, Belinda GAILS, MD  CC: Multiple joint complaints  YEP:Dlagzrupcz  06/15/2023 Continues to have arthritic changes.  We have discussed when to repeat the injections but try to space them out or possible.  Hopeful progression will make some difference.  Follow-up again in 6 to 8 weeks.  Continue bracing as needed time with patient discussing other treatment options and other ailments 31 minutes continue gabapentin  200 mg at night for her ailments     Patient has been doing very well overall but does need the epidurals from time to time to continue to be active.  Patient is also going to be the primary caregiver for her ailing husband.  Patient has as much energy as someone 15 years younger.  Encouraged her to continue to be active.  Has been doing a lot of gardening that could have exacerbated it.  Discussed with patient about icing regimen and home exercises.  Increase activity slowly.  Follow-up again in 6 to 8 weeks otherwise.     Update 08/18/2023 Belinda Montgomery is a 83 y.o. female coming in with complaint of L thumb and lower back pain. Patient states that her R shoulder has been painful recently. Pain in back of shoulder that radiates down into tricep. Hard to sleep on R side.   Pain in thumb is the same as last visit.   Feels like she strained her back last week when lifting heavy items. Had a few days when she could hardly move.   Saw rheumatology 10 days ago and said that there was little that they could do for her. She has been using CBD sublingually.    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    Past Medical History:  Diagnosis Date   Anemia    Anxiety    Arthritis    Basal cell carcinoma    CLL  (chronic lymphocytic leukemia) (HCC)    Colon polyps    Depression    Diverticulosis    HLD (hyperlipidemia)    IBS (irritable bowel syndrome)    Lyme disease 2023   Melanoma (HCC)    Pneumonia    Sleep apnea    Splenomegaly    Thyroid disease    Past Surgical History:  Procedure Laterality Date   CESAREAN SECTION  1973   CHOLECYSTECTOMY  2021   LUMBAR LAMINECTOMY  2016   NASAL SINUS SURGERY  1975   Social History   Socioeconomic History   Marital status: Married    Spouse name: Not on file   Number of children: 1   Years of education: Not on file   Highest education level: Not on file  Occupational History   Occupation: Fine Arts/Garden Estate agent  Tobacco Use   Smoking status: Former    Current packs/Belinda: 0.00    Types: Cigarettes    Quit date: 2012    Years since quitting: 13.6   Smokeless tobacco: Never  Vaping Use   Vaping status: Never Used  Substance and Sexual Activity   Alcohol use: Yes    Alcohol/week: 2.0 standard drinks of alcohol    Types: 2 Glasses of wine per week    Comment: 0-.5 per Belinda   Drug use: Never  Sexual activity: Not on file  Other Topics Concern   Not on file  Social History Narrative   Not on file   Social Drivers of Health   Financial Resource Strain: Medium Risk (08/12/2019)   Received from [facility] visits prior to 03/15/2022.   Overall Financial Resource Strain (CARDIA)    Difficulty of Paying Living Expenses: Somewhat hard  Food Insecurity: Low Risk  (02/23/2023)   [facility] from [facility]   Hunger Vital Sign    Within the past 12 months, you worried that your food would run out before you got money to buy more: Never true    Within the past 12 months, the food you bought just didn't last and you didn't have money to get more. : Never true  Transportation Needs: No Transportation Needs (02/23/2023)   Received from [facility]    In the past 12 months, has lack of reliable  transportation kept you from medical appointments, meetings, work or from getting things needed for daily living? : No  Physical Activity: Insufficiently Active (08/12/2019)   Received from [facility] visits prior to 03/15/2022.   Exercise Vital Sign    On average, how many days per week do you engage in moderate to strenuous exercise (like a brisk walk)?: 3 days    On average, how many minutes do you engage in exercise at this level?: 20 min  Stress: No Stress Concern Present (08/12/2019)   Received from [facility] visits prior to 03/15/2022.   [name]    Feeling of Stress : Only a little  Social Connections: Moderately Integrated (08/12/2019)   Received from [facility] visits prior to 03/15/2022.   Social Connection and Isolation Panel    In a typical week, how many times do you talk on the phone with family, friends, or neighbors?: More than three times a week    How often do you get together with friends or relatives?: Once a week    How often do you attend church or religious services?: More than 4 times per year    Do you belong to any clubs or organizations such as church groups, unions, fraternal or athletic groups, or school groups?: Yes    How often do you attend meetings of the clubs or organizations you belong to?: More than 4 times per year    Are you married, widowed, divorced, separated, never married, or living with a partner?: Divorced   No Known Allergies Family History  Problem Relation Age of Onset   Hodgkin's lymphoma Mother    Colon polyps Father    Diverticulitis Father        colostomy   Skin cancer Sister    Cancer Sister        type unknown, stomach area   Skin cancer Brother    Skin cancer Brother        melanoma   Hodgkin's lymphoma Maternal Grandmother    Gallbladder disease Maternal Grandmother    Colon cancer Neg Hx     Esophageal cancer Neg Hx    Inflammatory bowel disease Neg Hx    Liver disease Neg Hx    Pancreatic cancer Neg Hx    Rectal cancer Neg Hx    Stomach cancer Neg Hx     Current Outpatient Medications (Endocrine & Metabolic):    alendronate (FOSAMAX) 70 MG tablet,  Take 70 mg by mouth once a week.  Current Outpatient Medications (Cardiovascular):    cholestyramine  (QUESTRAN ) 4 g packet, Take 1 packet (4 g total) by mouth daily.    Current Outpatient Medications (Hematological):    cyanocobalamin (VITAMIN B12) 500 MCG tablet, Take 500 mcg by mouth daily.   folic acid (FOLVITE) 1 MG tablet, Take 1 mg by mouth daily.  Current Outpatient Medications (Other):    DULoxetine  (CYMBALTA ) 20 MG capsule, Take 1 capsule (20 mg total) by mouth daily.   gabapentin  (NEURONTIN ) 100 MG capsule, Take 2 capsules (200 mg total) by mouth at bedtime. (Patient taking differently: Take 200 mg by mouth as needed.)   hydrocortisone 2.5 % ointment, Apply 1 Application topically as needed.   loperamide (IMODIUM A-D) 2 MG tablet, Take 2 mg by mouth as needed.   MAGNESIUM CITRATE PO*, Take 1 tablet by mouth daily.   Multiple Vitamin (MULTIVITAMIN PO), Take 1 capsule by mouth daily.   Vitamin D , Ergocalciferol , (DRISDOL ) 1.25 MG (50000 UNIT) CAPS capsule, TAKE 1 CAPSULE BY MOUTH EVERY 7 DAYS * These medications belong to multiple therapeutic classes and are listed under each applicable group.   Reviewed prior external information including notes and imaging from  primary care provider As well as notes that were available from care everywhere and other healthcare systems.  Past medical history, social, surgical and family history all reviewed in electronic medical record.  No pertanent information unless stated regarding to the chief complaint.   Review of Systems:  No headache, visual changes, nausea, vomiting, diarrhea, constipation, dizziness, abdominal pain, skin rash, fevers, chills, night sweats, weight  loss, swollen lymph nodes, body aches, joint swelling, chest pain, shortness of breath, mood changes. POSITIVE muscle aches  Objective  Blood pressure 108/60, pulse 70, height 5' 5.5 (1.664 m), weight 148 lb (67.1 kg), SpO2 97%.   General: No apparent distress alert and oriented x3 mood and affect normal, dressed appropriately.  HEENT: Pupils equal, extraocular movements intact  Respiratory: Patient's speak in full sentences and does not appear short of breath  Cardiovascular: No lower extremity edema, non tender, no erythema  Arthritis of multiple joints noted.  Significant tightness noted of the shoulder.  Patient does have tenderness with any type of movement.  Positive crossover.  No swelling over the acromioclavicular joint.  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   Procedure: Real-time Ultrasound Guided Injection of right glenohumeral joint Device: GE Logiq Q7  Ultrasound guided injection is preferred based studies that show increased duration, increased effect, greater accuracy, decreased procedural pain, increased response rate with ultrasound guided versus blind injection.  Verbal informed consent obtained.  Time-out conducted.  Noted no overlying erythema, induration, or other signs of local infection.  Skin prepped in a sterile fashion.  Local anesthesia: Topical Ethyl chloride.  With sterile technique and under real time ultrasound guidance:  Joint visualized.  23g 1  inch needle inserted posterior approach. Pictures taken for needle placement. Patient did have injection of , 2 cc of 0.5% Marcaine, and 1.0 cc of Kenalog  40 mg/dL. Completed without difficulty  Pain immediately resolved suggesting accurate placement of the medication.  Images saved Advised to call if fevers/chills, erythema, induration, drainage, or persistent bleeding.  Impression: Technically successful ultrasound guided injection.  Procedure: Real-time Ultrasound Guided Injection of right  acromioclavicular joint Device: GE Logiq Q7 Ultrasound guided injection is preferred based studies that show increased duration, increased effect, greater accuracy, decreased procedural pain, increased response rate, and decreased cost  with ultrasound guided versus blind injection.  Verbal informed consent obtained.  Time-out conducted.  Noted no overlying erythema, induration, or other signs of local infection.  Skin prepped in a sterile fashion.  Local anesthesia: Topical Ethyl chloride.  With sterile technique and under real time ultrasound guidance: With a 25-gauge half inch needle injected with 0.5 cc of 0.5% Marcaine and 0.5 cc of Kenalog  40 mg/mL Completed without difficulty  Pain immediately resolved suggesting accurate placement of the medication.  Advised to call if fevers/chills, erythema, induration, drainage, or persistent bleeding.  Impression: Technically successful ultrasound guided injection.  Procedure: Real-time Ultrasound Guided Injection of left CMC joint Device: GE Logiq Q7 Ultrasound guided injection is preferred based studies that show increased duration, increased effect, greater accuracy, decreased procedural pain, increased response rate, and decreased cost with ultrasound guided versus blind injection.  Verbal informed consent obtained.  Time-out conducted.  Noted no overlying erythema, induration, or other signs of local infection.  Skin prepped in a sterile fashion.  Local anesthesia: Topical Ethyl chloride.  With sterile technique and under real time ultrasound guidance: With a 25-gauge half inch needle injected with 0.5 cc of 0.5% Marcaine and 0.5 cc of Kenalog  40 mg/mL Completed without difficulty  Pain immediately resolved suggesting accurate placement of the medication.  Advised to call if fevers/chills, erythema, induration, drainage, or persistent bleeding.  Impression: Technically successful ultrasound guided injection.   Impression and  Recommendations:     The above documentation has been reviewed and is accurate and complete Tenna Lacko M Aowyn Rozeboom, DO

## 2023-08-16 ENCOUNTER — Encounter: Payer: Self-pay | Admitting: Gastroenterology

## 2023-08-16 NOTE — Progress Notes (Signed)
 Review of outside records that will be scanned into the chart  07/27/2023 12.3/27.1 WBC 28.6 MCV 92 Platelets 183  The rest of the records that were obtained are laboratories from care everywhere which are visible for her follow-up of CLL.  No GI records were in this chart  I will forward this to my team to see if they can ask specifically for records from 03/15/2019 Dr. Tawni, from 05/16/2019 Dr. Tawni, and from 03/31/2022 colonoscopy Dr. Celine

## 2023-08-18 ENCOUNTER — Other Ambulatory Visit: Payer: Self-pay

## 2023-08-18 ENCOUNTER — Encounter: Payer: Self-pay | Admitting: Family Medicine

## 2023-08-18 ENCOUNTER — Ambulatory Visit (INDEPENDENT_AMBULATORY_CARE_PROVIDER_SITE_OTHER): Payer: MEDICARE | Admitting: Family Medicine

## 2023-08-18 VITALS — BP 108/60 | HR 70 | Ht 65.5 in | Wt 148.0 lb

## 2023-08-18 DIAGNOSIS — G8929 Other chronic pain: Secondary | ICD-10-CM | POA: Diagnosis not present

## 2023-08-18 DIAGNOSIS — M25511 Pain in right shoulder: Secondary | ICD-10-CM | POA: Diagnosis not present

## 2023-08-18 DIAGNOSIS — M1812 Unilateral primary osteoarthritis of first carpometacarpal joint, left hand: Secondary | ICD-10-CM | POA: Diagnosis not present

## 2023-08-18 DIAGNOSIS — C9110 Chronic lymphocytic leukemia of B-cell type not having achieved remission: Secondary | ICD-10-CM

## 2023-08-18 DIAGNOSIS — M19011 Primary osteoarthritis, right shoulder: Secondary | ICD-10-CM

## 2023-08-18 MED ORDER — DULOXETINE HCL 20 MG PO CPEP: 20.0000 mg | ORAL_CAPSULE | Freq: Every day | ORAL | 0 refills | Status: DC

## 2023-08-18 NOTE — Patient Instructions (Signed)
 Injection today See you again in 3 months Tumeric Glucosamine Tylenol 650mg  3x a day

## 2023-08-18 NOTE — Assessment & Plan Note (Signed)
 Chronic, with exacerbation.  Still wearing a brace, still avoiding surgical intervention.  Increase activity slowly.  Follow-up again 6 to 8 weeks otherwise.

## 2023-08-18 NOTE — Assessment & Plan Note (Signed)
 Encouraged her to continue to follow-up with oncology.  Not doing any significant treatment at this time.  White blood cell count continues to elevate.

## 2023-08-18 NOTE — Assessment & Plan Note (Signed)
 Chronic pain noted.  Given injection.  Tolerated the procedure well.  Discussed further imaging with patient having the history of melanoma in the area.  Patient wants to hold at this point.  Discussed the potential for advanced imaging.  Still would not want any surgical intervention.  Follow-up with me again in 3 months

## 2023-08-18 NOTE — Assessment & Plan Note (Signed)
 Has been sometime since we have done an injection.  Hopefully that this will make some improvement.  Discussed icing regimen and home exercises again.  Increase activity slowly.  Follow-up again in 6 to 8 weeks

## 2023-09-03 ENCOUNTER — Ambulatory Visit: Payer: MEDICARE | Admitting: Sports Medicine

## 2023-09-03 ENCOUNTER — Encounter: Payer: Self-pay | Admitting: Family Medicine

## 2023-09-03 ENCOUNTER — Ambulatory Visit (INDEPENDENT_AMBULATORY_CARE_PROVIDER_SITE_OTHER): Payer: MEDICARE

## 2023-09-03 VITALS — BP 120/62 | HR 68 | Ht 65.5 in

## 2023-09-03 DIAGNOSIS — G8929 Other chronic pain: Secondary | ICD-10-CM

## 2023-09-03 DIAGNOSIS — M25562 Pain in left knee: Secondary | ICD-10-CM | POA: Diagnosis not present

## 2023-09-03 NOTE — Progress Notes (Signed)
 [facility] [address] [place] [address] Phone: [phone]   Assessment and Plan:     1. Chronic pain of left knee -Chronic with exacerbation, initial visit - Recurrence of left knee pain x 1 day with history of intermittent left knee pain status post meniscectomy.  Most consistent with overuse including new activities and walking long distances - Recommend starting HEP for knee strengthening and stabilization - Recommend using ice for first 48 hours after injury and then transitioning to heat for sore and tight musculature - Use Tylenol 500 to 1000 mg tablets 2-3 times a day for day-to-day pain relief - Recommend Voltaren gel topically over areas of pain - Recommend comfortable and supportive footwear.  May use [employer] or other similar store to try shoe types.  [place], on [place], [place], other similar footwear may be beneficial -X-ray obtained in clinic.  My interpretation: No acute fracture or dislocation.  15 additional minutes spent for educating Therapeutic Home Exercise Program.  This included exercises focusing on stretching, strengthening, with focus on eccentric aspects.   Long term goals include an improvement in range of motion, strength, endurance as well as avoiding reinjury. Patient's frequency would include in 1-2 times a day, 3-5 times a week for a duration of 6-12 weeks. Proper technique shown and discussed handout in great detail with ATC.  All questions were discussed and answered.     Pertinent previous records reviewed include none  Follow Up: As needed if no improvement or worsening symptoms.  Could consider CSI versus physical therapy   Subjective:   I, [name], am serving as a scribe for Dr. Morene Mace  Chief Complaint: Left knee pain   HPI:   09/03/23 Patient is a 83 year old female presenting to [facility] for left knee pain. Patient usually sees Dr. Claudene, but states  yesterday she stepped off of grass on to pavement and her knee twisted. Left knee she has had meniscal surgery in the past. Patient wants to make sure she didn't do anything bad. No swelling, locates pain to around the patella and if stepping wrong will radiate either up or down the leg. Patient has not taken anything or used any ice or heat.   Relevant Historical Information: History of CLL, hypothyroidism  Additional pertinent review of systems negative.   Current Outpatient Medications:    alendronate (FOSAMAX) 70 MG tablet, Take 70 mg by mouth once a week., Disp: , Rfl:    cholestyramine  (QUESTRAN ) 4 g packet, Take 1 packet (4 g total) by mouth daily., Disp: 30 each, Rfl: 2   cyanocobalamin (VITAMIN B12) 500 MCG tablet, Take 500 mcg by mouth daily., Disp: , Rfl:    DULoxetine  (CYMBALTA ) 20 MG capsule, Take 1 capsule (20 mg total) by mouth daily., Disp: 30 capsule, Rfl: 0   folic acid (FOLVITE) 1 MG tablet, Take 1 mg by mouth daily., Disp: , Rfl:    gabapentin  (NEURONTIN ) 100 MG capsule, Take 2 capsules (200 mg total) by mouth at bedtime. (Patient taking differently: Take 200 mg by mouth as needed.), Disp: 180 capsule, Rfl: 0   hydrocortisone 2.5 % ointment, Apply 1 Application topically as needed., Disp: , Rfl:    loperamide (IMODIUM A-D) 2 MG tablet, Take 2 mg by mouth as needed., Disp: , Rfl:    MAGNESIUM CITRATE PO, Take 1 tablet by mouth daily., Disp: , Rfl:    Multiple Vitamin (MULTIVITAMIN PO), Take 1 capsule  by mouth daily., Disp: , Rfl:    Vitamin D , Ergocalciferol , (DRISDOL ) 1.25 MG (50000 UNIT) CAPS capsule, TAKE 1 CAPSULE BY MOUTH EVERY 7 DAYS, Disp: 12 capsule, Rfl: 0   Objective:     Vitals:   09/03/23 1027  BP: 120/62  Pulse: 68  SpO2: 97%  Height: 5' 5.5 (1.664 m)      Body mass index is 24.25 kg/m.    Physical Exam:    General:  awake, alert oriented, no acute distress nontoxic Skin: no suspicious lesions or rashes Neuro:sensation intact and strength 5/5 with  no deficits, no atrophy, normal muscle tone Psych: No signs of anxiety, depression or other mood disorder  Left knee: No swelling No deformity Neg fluid wave, joint milking ROM Flex 110, Ext 0 TTP distal quadricep, distal hamstring, proximal gastroc, medial and lateral femoral condyle NTTP over the  patella, plica, patella tendon, tibial tuberostiy, fibular head, posterior fossa, pes anserine bursa, gerdy's tubercle, medial jt line, lateral jt line Neg anterior and posterior drawer Neg lachman Neg sag sign Negative varus stress Negative valgus stress Negative McMurray Positive Thessaly  Gait normal    Electronically signed by:  Odis Mace D.CLEMENTEEN [facility] 11:10 AM 09/03/23

## 2023-09-03 NOTE — Patient Instructions (Addendum)
 Good to see you  Tylenol [phone] mg 2-3 times a day for pain relief  Ice for 24 hours then transition to heat Knee exercises given Recommend going to [employer] or other similar store for insoles and shoes Recommend shoe types brooks on [place] and [place]  For husband recommend altra  Keep follow up with Dr. Claudene follow up with me if needing to be seen sooner

## 2023-09-04 ENCOUNTER — Encounter: Payer: Self-pay | Admitting: Gastroenterology

## 2023-09-04 NOTE — Progress Notes (Signed)
 Hydrogen breath test performed 03/15/2019 at [facility] No significant change in hydrogen or methane production over 2 hours.  Bacterial overgrowth is not present.  This result will be scanned into the chart.  Will work on setting patient up to have follow-up in clinic.  We could consider updated SIBO breath testing since it has been years since the last 1 and she would be at risk of this.  If interested, she can have that before follow-up in clinic (follow-up in 2 to 4 months.   Aloha Finner, MD [facility] Office # [phone]

## 2023-09-15 ENCOUNTER — Ambulatory Visit: Payer: Self-pay | Admitting: Sports Medicine

## 2023-10-16 NOTE — Progress Notes (Signed)
 [name] [address] [place] [address] Phone: [phone] Subjective:   Belinda Montgomery, am serving as a scribe for Dr. Arthea Claudene.  I'm seeing this patient by the request  of:  Day, [name], MD  CC:   YEP:Dlagzrupcz  08/18/2023 Chronic, with exacerbation.  Still wearing a brace, still avoiding surgical intervention.  Increase activity slowly.  Follow-up again 6 to 8 weeks otherwise.     Has been sometime since we have done an injection. Hopefully that this will make some improvement. Discussed icing regimen and home exercises again. Increase activity slowly. Follow-up again in 6 to 8 weeks   Encouraged her to continue to follow-up with oncology.  Not doing any significant treatment at this time.  White blood cell count continues to elevate.      Update 10/18/2023 Belinda Montgomery is a 83 y.o. female coming in with complaint of R shoulder, L knee, and L thumb pain. Saw Dr. Leonce since last visit for L knee pain. Given exercises. (-) xray. Patient states that she has had increase in knee pain. Unable to fully flex knee. Unable to sleep due to pain. In 2019 she had mensicus repair surgery.          Past Medical History:  Diagnosis Date   Anemia    Anxiety    Arthritis    Basal cell carcinoma    CLL (chronic lymphocytic leukemia) (HCC)    Colon polyps    Depression    Diverticulosis    HLD (hyperlipidemia)    IBS (irritable bowel syndrome)    Lyme disease 2023   Melanoma (HCC)    Pneumonia    Sleep apnea    Splenomegaly    Thyroid disease    Past Surgical History:  Procedure Laterality Date   CESAREAN SECTION  1973   CHOLECYSTECTOMY  2021   LUMBAR LAMINECTOMY  2016   NASAL SINUS SURGERY  1975   Social History   Socioeconomic History   Marital status: Married    Spouse name: Not on file   Number of children: 1   Years of education: Not on file   Highest education level: Not on file  Occupational History    Occupation: Fine Arts/Garden Estate agent  Tobacco Use   Smoking status: Former    Current packs/day: 0.00    Types: Cigarettes    Quit date: 2012    Years since quitting: 13.7   Smokeless tobacco: Never  Vaping Use   Vaping status: Never Used  Substance and Sexual Activity   Alcohol use: Yes    Alcohol/week: 2.0 standard drinks of alcohol    Types: 2 Glasses of wine per week    Comment: 0-.5 per day   Drug use: Never   Sexual activity: Not on file  Other Topics Concern   Not on file  Social History Narrative   Not on file   Social Drivers of Health   Financial Resource Strain: Medium Risk (08/12/2019)   Received from [facility] visits prior to 03/15/2022.   Overall Financial Resource Strain (CARDIA)    Difficulty of Paying Living Expenses: Somewhat hard  Food Insecurity: Low Risk  (02/23/2023)   Received from [facility]   Hunger Vital Sign    Within the past 12 months, you worried that your food would run out before you got money to buy more: Never true    Within the past 12 months, the food you  bought just didn't last and you didn't have money to get more. : Never true  Transportation Needs: No Transportation Needs (02/23/2023)   Received from [facility]    In the past 12 months, has lack of reliable transportation kept you from medical appointments, meetings, work or from getting things needed for daily living? : No  Physical Activity: Insufficiently Active (08/12/2019)   Received from [facility] visits prior to 03/15/2022.   Exercise Vital Sign    On average, how many days per week do you engage in moderate to strenuous exercise (like a brisk walk)?: 3 days    On average, how many minutes do you engage in exercise at this level?: 20 min  Stress: No Stress Concern Present (08/12/2019)   Received from [facility] visits prior to 03/15/2022.   [employer] of Occupational Health - Occupational  Stress Questionnaire    Feeling of Stress : Only a little  Social Connections: Moderately Integrated (08/12/2019)   Received from [facility] visits prior to 03/15/2022.   Social Connection and Isolation Panel    In a typical week, how many times do you talk on the phone with family, friends, or neighbors?: More than three times a week    How often do you get together with friends or relatives?: Once a week    How often do you attend church or religious services?: More than 4 times per year    Do you belong to any clubs or organizations such as church groups, unions, fraternal or athletic groups, or school groups?: Yes    How often do you attend meetings of the clubs or organizations you belong to?: More than 4 times per year    Are you married, widowed, divorced, separated, never married, or living with a partner?: Divorced   No Known Allergies Family History  Problem Relation Age of Onset   Hodgkin's lymphoma Mother    Colon polyps Father    Diverticulitis Father        colostomy   Skin cancer Sister    Cancer Sister        type unknown, stomach area   Skin cancer Brother    Skin cancer Brother        melanoma   Hodgkin's lymphoma Maternal Grandmother    Gallbladder disease Maternal Grandmother    Colon cancer Neg Hx    Esophageal cancer Neg Hx    Inflammatory bowel disease Neg Hx    Liver disease Neg Hx    Pancreatic cancer Neg Hx    Rectal cancer Neg Hx    Stomach cancer Neg Hx     Current Outpatient Medications (Endocrine & Metabolic):    alendronate (FOSAMAX) 70 MG tablet, Take 70 mg by mouth once a week.  Current Outpatient Medications (Cardiovascular):    cholestyramine  (QUESTRAN ) 4 g packet, Take 1 packet (4 g total) by mouth daily.    Current Outpatient Medications (Hematological):    cyanocobalamin (VITAMIN B12) 500 MCG tablet, Take 500 mcg by mouth daily.   folic acid (FOLVITE) 1 MG tablet, Take 1 mg by mouth daily.  Current Outpatient  Medications (Other):    DULoxetine  (CYMBALTA ) 20 MG capsule, Take 1 capsule (20 mg total) by mouth daily.   hydrocortisone 2.5 % ointment, Apply 1 Application topically as needed.   loperamide (IMODIUM A-D) 2 MG tablet, Take 2 mg by mouth as needed.   MAGNESIUM CITRATE PO*, Take  1 tablet by mouth daily.   Multiple Vitamin (MULTIVITAMIN PO), Take 1 capsule by mouth daily.   Vitamin D , Ergocalciferol , (DRISDOL ) 1.25 MG (50000 UNIT) CAPS capsule, TAKE 1 CAPSULE BY MOUTH EVERY 7 DAYS   gabapentin  (NEURONTIN ) 100 MG capsule, Take 2 capsules (200 mg total) by mouth at bedtime. (Patient taking differently: Take 200 mg by mouth as needed.) * These medications belong to multiple therapeutic classes and are listed under each applicable group.   Reviewed prior external information including notes and imaging from  primary care provider As well as notes that were available from care everywhere and other healthcare systems.  Past medical history, social, surgical and family history all reviewed in electronic medical record.  No pertanent information unless stated regarding to the chief complaint.   Review of Systems:  No headache, visual changes, nausea, vomiting, diarrhea, constipation, dizziness, abdominal pain, skin rash, fevers, chills, night sweats, weight loss, swollen lymph nodes, body aches, joint swelling, chest pain, shortness of breath, mood changes. POSITIVE muscle aches  Objective  Blood pressure 138/72, pulse 65, height 5' 5.5 (1.664 m), weight 148 lb (67.1 kg), SpO2 98%.   General: No apparent distress alert and oriented x3 mood and affect normal, dressed appropriately.  HEENT: Pupils equal, extraocular movements intact  Respiratory: Patient's speak in full sentences and does not appear short of breath  Severely antalgic gait noted.  Instability noted with valgus and varus force noted.  Tender to palpation more over the medial and lateral joint line.  Only 95 degrees of flexion  noted.  Limited muscular skeletal ultrasound was performed and interpreted by CLAUDENE HUSSAR, M  Limited ultrasound of patient's knee shows that there is a likely LCL tear noted with hypoechoic changes consistent with a partial tearing.  Dynamic testing does not show true gapping but difficult to assess on patient.  Medial meniscus does have some degenerative changes with postsurgical changes as well as acute displacement noted of the posterior aspect of the meniscus.    Impression and Recommendations:    The above documentation has been reviewed and is accurate and complete Keniah Klemmer M Corynn Solberg, DO

## 2023-10-19 ENCOUNTER — Other Ambulatory Visit: Payer: Self-pay

## 2023-10-19 ENCOUNTER — Encounter: Payer: Self-pay | Admitting: Family Medicine

## 2023-10-19 ENCOUNTER — Ambulatory Visit (INDEPENDENT_AMBULATORY_CARE_PROVIDER_SITE_OTHER): Payer: MEDICARE | Admitting: Family Medicine

## 2023-10-19 VITALS — BP 138/72 | HR 65 | Ht 65.5 in | Wt 148.0 lb

## 2023-10-19 DIAGNOSIS — M25362 Other instability, left knee: Secondary | ICD-10-CM | POA: Diagnosis not present

## 2023-10-19 DIAGNOSIS — G8929 Other chronic pain: Secondary | ICD-10-CM

## 2023-10-19 DIAGNOSIS — M25562 Pain in left knee: Secondary | ICD-10-CM

## 2023-10-19 NOTE — Patient Instructions (Addendum)
 MRI L knee [id number] See me in 5-6 weeks

## 2023-10-19 NOTE — Assessment & Plan Note (Addendum)
 Patient previously did have some instability in his status post meniscectomy multiple years ago.  I am concerned that unfortunately what is left of the medial meniscus is giving her limited range of motion.  Concern though also on ultrasound and the findings we are noticing of the LCL but very difficult to assess in this individual patient.  Concerned that there is more internal derangement noted.  At this point do feel that we do need to further evaluate with advanced imaging.  X-rays do not show any significant bony abnormality that would be contributing to this.  She could be potentially a candidate for an arthroscopic procedure depending on findings.  Follow-up again in 6 to 8 weeks

## 2023-10-20 ENCOUNTER — Ambulatory Visit
Admission: RE | Admit: 2023-10-20 | Discharge: 2023-10-20 | Disposition: A | Discharge status: Home / Self Care | Payer: MEDICARE | Source: Ambulatory Visit | Attending: Family Medicine | Admitting: Family Medicine

## 2023-10-20 DIAGNOSIS — G8929 Other chronic pain: Secondary | ICD-10-CM

## 2023-10-21 ENCOUNTER — Other Ambulatory Visit: Payer: Self-pay

## 2023-10-21 ENCOUNTER — Ambulatory Visit: Payer: Self-pay | Admitting: Family Medicine

## 2023-10-21 DIAGNOSIS — G8929 Other chronic pain: Secondary | ICD-10-CM

## 2023-10-27 ENCOUNTER — Encounter: Payer: Self-pay | Admitting: Family Medicine

## 2023-11-25 NOTE — Progress Notes (Signed)
 [name] [address] [place] [address] Phone: [phone] Subjective:   LILLETTE [name], am serving as a scribe for Dr. Arthea Claudene.  I'm seeing this patient by the request  of:  Day, [name], MD  CC: left knee   YEP:Dlagzrupcz  10/19/2023 Patient previously did have some instability in his status post meniscectomy multiple years ago.  I am concerned that unfortunately what is left of the medial meniscus is giving her limited range of motion.  Concern though also on ultrasound and the findings we are noticing of the LCL but very difficult to assess in this individual patient.  Concerned that there is more internal derangement noted.  At this point do feel that we do need to further evaluate with advanced imaging.  X-rays do not show any significant bony abnormality that would be contributing to this.  She could be potentially a candidate for an arthroscopic procedure depending on findings.  Follow-up again in 6 to 8 weeks      Update 11/26/2023 CORLIS ANGELICA is a 83 y.o. female coming in with complaint of L knee pain. Patient states that she had surgery 2 weeks ago. Started PT.    R shoulder is doing ok since last visit.   L wrist and thumb are doing ok. Has not needed brace. Painful intermittently.     Past Medical History:  Diagnosis Date   Anemia    Anxiety    Arthritis    Basal cell carcinoma    CLL (chronic lymphocytic leukemia) (HCC)    Colon polyps    Depression    Diverticulosis    HLD (hyperlipidemia)    IBS (irritable bowel syndrome)    Lyme disease 2023   Melanoma (HCC)    Pneumonia    Sleep apnea    Splenomegaly    Thyroid disease    Past Surgical History:  Procedure Laterality Date   CESAREAN SECTION  1973   CHOLECYSTECTOMY  2021   LUMBAR LAMINECTOMY  2016   NASAL SINUS SURGERY  1975   Social History   Socioeconomic History   Marital status: Married    Spouse name: Not on file   Number of children: 1    Years of education: Not on file   Highest education level: Not on file  Occupational History   Occupation: Fine Arts/Garden Estate Agent  Tobacco Use   Smoking status: Former    Current packs/day: 0.00    Types: Cigarettes    Quit date: 2012    Years since quitting: 13.8   Smokeless tobacco: Never  Vaping Use   Vaping status: Never Used  Substance and Sexual Activity   Alcohol use: Yes    Alcohol/week: 2.0 standard drinks of alcohol    Types: 2 Glasses of wine per week    Comment: 0-.5 per day   Drug use: Never   Sexual activity: Not on file  Other Topics Concern   Not on file  Social History Narrative   Not on file   Social Drivers of Health   Financial Resource Strain: Medium Risk (08/12/2019)   Received from [facility] visits prior to 03/15/2022.   Overall Financial Resource Strain (CARDIA)    Difficulty of Paying Living Expenses: Somewhat hard  Food Insecurity: Low Risk  (02/23/2023)   Received from [facility]   Hunger Vital Sign    Within the past 12 months, you worried that your food would run out before  you got money to buy more: Never true    Within the past 12 months, the food you bought just didn't last and you didn't have money to get more. : Never true  Transportation Needs: No Transportation Needs (02/23/2023)   Received from [facility]    In the past 12 months, has lack of reliable transportation kept you from medical appointments, meetings, work or from getting things needed for daily living? : No  Physical Activity: Insufficiently Active (08/12/2019)   Received from [facility] visits prior to 03/15/2022.   Exercise Vital Sign    On average, how many days per week do you engage in moderate to strenuous exercise (like a brisk walk)?: 3 days    On average, how many minutes do you engage in exercise at this level?: 20 min  Stress: No Stress Concern Present (08/12/2019)   Received from [facility] visits prior to 03/15/2022.   [name]    Feeling of Stress : Only a little  Social Connections: Moderately Integrated (08/12/2019)   Received from [facility] visits prior to 03/15/2022.   Social Connection and Isolation Panel    In a typical week, how many times do you talk on the phone with family, friends, or neighbors?: More than three times a week    How often do you get together with friends or relatives?: Once a week    How often do you attend church or religious services?: More than 4 times per year    Do you belong to any clubs or organizations such as church groups, unions, fraternal or athletic groups, or school groups?: Yes    How often do you attend meetings of the clubs or organizations you belong to?: More than 4 times per year    Are you married, widowed, divorced, separated, never married, or living with a partner?: Divorced   No Known Allergies Family History  Problem Relation Age of Onset   Hodgkin's lymphoma Mother    Colon polyps Father    Diverticulitis Father        colostomy   Skin cancer Sister    Cancer Sister        type unknown, stomach area   Skin cancer Brother    Skin cancer Brother        melanoma   Hodgkin's lymphoma Maternal Grandmother    Gallbladder disease Maternal Grandmother    Colon cancer Neg Hx    Esophageal cancer Neg Hx    Inflammatory bowel disease Neg Hx    Liver disease Neg Hx    Pancreatic cancer Neg Hx    Rectal cancer Neg Hx    Stomach cancer Neg Hx     Current Outpatient Medications (Endocrine & Metabolic):    alendronate (FOSAMAX) 70 MG tablet, Take 70 mg by mouth once a week.  Current Outpatient Medications (Cardiovascular):    cholestyramine  (QUESTRAN ) 4 g packet, Take 1 packet (4 g total) by mouth daily.    Current Outpatient Medications (Hematological):    cyanocobalamin (VITAMIN B12) 500 MCG tablet, Take 500 mcg by  mouth daily.   folic acid (FOLVITE) 1 MG tablet, Take 1 mg by mouth daily.  Current Outpatient Medications (Other):    DULoxetine  (CYMBALTA ) 20 MG capsule, Take 1 capsule (20 mg total) by mouth daily.   hydrocortisone 2.5 % ointment, Apply 1 Application topically as needed.  loperamide (IMODIUM A-D) 2 MG tablet, Take 2 mg by mouth as needed.   MAGNESIUM CITRATE PO*, Take 1 tablet by mouth daily.   Multiple Vitamin (MULTIVITAMIN PO), Take 1 capsule by mouth daily.   Vitamin D , Ergocalciferol , (DRISDOL ) 1.25 MG (50000 UNIT) CAPS capsule, TAKE 1 CAPSULE BY MOUTH EVERY 7 DAYS   gabapentin  (NEURONTIN ) 100 MG capsule, Take 2 capsules (200 mg total) by mouth at bedtime. (Patient taking differently: Take 200 mg by mouth as needed.) * These medications belong to multiple therapeutic classes and are listed under each applicable group.   Reviewed prior external information including notes and imaging from  primary care provider As well as notes that were available from care everywhere and other healthcare systems.  Past medical history, social, surgical and family history all reviewed in electronic medical record.  No pertanent information unless stated regarding to the chief complaint.   Review of Systems:  No headache, visual changes, nausea, vomiting, diarrhea, constipation, dizziness, abdominal pain, skin rash, fevers, chills, night sweats, weight loss, swollen lymph nodes, body aches, joint swelling, chest pain, shortness of breath, mood changes. POSITIVE muscle aches  Objective  Blood pressure 120/76, pulse 72, height 5' 5.5 (1.664 m), weight 148 lb (67.1 kg), SpO2 98%.   General: No apparent distress alert and oriented x3 mood and affect normal, dressed appropriately.  HEENT: Pupils equal, extraocular movements intact  Respiratory: Patient's speak in full sentences and does not appear short of breath  Cardiovascular: No lower extremity edema, non tender, no erythema  Low back exam does  have loss of lordosis does have some degenerative disc disease but sitting comfortably.  Left knee exam shows postsurgical changes noted but significant improvement in range of motion.  Trace effusion noted of the knee.  No warmness to touch.   Impression and Recommendations:     The above documentation has been reviewed and is accurate and complete Rhylynn Perdomo M Masahiro Iglesia, DO

## 2023-11-26 ENCOUNTER — Encounter: Payer: Self-pay | Admitting: Family Medicine

## 2023-11-26 ENCOUNTER — Ambulatory Visit: Payer: MEDICARE | Admitting: Family Medicine

## 2023-11-26 VITALS — BP 120/76 | HR 72 | Ht 65.5 in | Wt 148.0 lb

## 2023-11-26 DIAGNOSIS — M5126 Other intervertebral disc displacement, lumbar region: Secondary | ICD-10-CM

## 2023-11-26 DIAGNOSIS — M25362 Other instability, left knee: Secondary | ICD-10-CM | POA: Diagnosis not present

## 2023-11-26 DIAGNOSIS — R2689 Other abnormalities of gait and mobility: Secondary | ICD-10-CM | POA: Diagnosis not present

## 2023-11-26 DIAGNOSIS — R279 Unspecified lack of coordination: Secondary | ICD-10-CM

## 2023-11-26 MED ORDER — DULOXETINE HCL 20 MG PO CPEP: 20.0000 mg | ORAL_CAPSULE | Freq: Every day | ORAL | 0 refills | Status: AC

## 2023-11-26 NOTE — Assessment & Plan Note (Addendum)
 Discussed with patient at length.  Continue to work on core strengthening.  Will have patient start with balance and coordination which I think will be beneficial.  Follow-up with me again in 6 to 12 weeks refilled patient's Cymbalta  and hopefully this will be beneficial as well.

## 2023-11-26 NOTE — Patient Instructions (Signed)
 PT [facility] Doing great otherwise Cymbalta  refilled See you again in 3 months

## 2023-11-26 NOTE — Assessment & Plan Note (Signed)
 Status post meniscal surgery.  Will continue with physical therapy.

## 2023-12-18 ENCOUNTER — Encounter: Payer: Self-pay | Admitting: Family Medicine

## 2023-12-18 MED ORDER — VITAMIN D (ERGOCALCIFEROL) 1.25 MG (50000 UNIT) PO CAPS: 50000.0000 [IU] | ORAL_CAPSULE | ORAL | 0 refills | Status: AC

## 2024-01-27 NOTE — Progress Notes (Signed)
 " [name] [address] [place] [address] Phone: [phone] Subjective:   Belinda Montgomery, am serving as a scribe for Dr. Arthea Claudene.  I'm seeing this patient by the request  of:  Day, [name], MD  CC: knee pain follow up   YEP:Dlagzrupcz  11/26/2023 Status post meniscal surgery.  Will continue with physical therapy.     Discussed with patient at length.  Continue to work on core strengthening.  Will have patient start with balance and coordination which I think will be beneficial.  Follow-up with me again in 6 to 12 weeks refilled patient's Cymbalta  and hopefully this will be beneficial as well.     Update 01/29/2024 Belinda Montgomery is a 84 y.o. female coming in with complaint of balance problems and L knee pain. Patient states that her knee pain is over patella and over medial aspect but pain can move throughout entire joint. Pain also causing L hip and R knee pain. Antalgic gait noted today. Having a hard time sleeping. Pain much worse when she keeps leg in once position for a long period of time. Has been working on [employer].       Past Medical History:  Diagnosis Date   Anemia    Anxiety    Arthritis    Basal cell carcinoma    CLL (chronic lymphocytic leukemia) (HCC)    Colon polyps    Depression    Diverticulosis    HLD (hyperlipidemia)    IBS (irritable bowel syndrome)    Lyme disease 2023   Melanoma (HCC)    Pneumonia    Sleep apnea    Splenomegaly    Thyroid disease    Past Surgical History:  Procedure Laterality Date   CESAREAN SECTION  1973   CHOLECYSTECTOMY  2021   LUMBAR LAMINECTOMY  2016   NASAL SINUS SURGERY  1975   Social History   Socioeconomic History   Marital status: Married    Spouse name: Not on file   Number of children: 1   Years of education: Not on file   Highest education level: Not on file  Occupational History   Occupation: Fine Arts/Garden Estate Agent  Tobacco Use   Smoking status: Former     Current packs/day: 0.00    Types: Cigarettes    Quit date: 2012    Years since quitting: 14.0   Smokeless tobacco: Never  Vaping Use   Vaping status: Never Used  Substance and Sexual Activity   Alcohol use: Yes    Alcohol/week: 2.0 standard drinks of alcohol    Types: 2 Glasses of wine per week    Comment: 0-.5 per day   Drug use: Never   Sexual activity: Not on file  Other Topics Concern   Not on file  Social History Narrative   Not on file   Social Drivers of Health   Tobacco Use: Medium Risk (01/21/2024)   Received from [facility]   Patient History    Smoking Tobacco Use: Former    Smokeless Tobacco Use: Never    Passive Exposure: Never  Physicist, Medical Strain: Not on file  Food Insecurity: Low Risk (12/16/2023)   Received from [facility]   Epic    Within the past 12 months, you worried that your food would run out before you got money to buy more: Never true    Within the past 12 months, the food you bought just didn't last and you didn't  have money to get more. : Never true  Transportation Needs: No Transportation Needs (12/16/2023)   Received from [facility]    In the past 12 months, has lack of reliable transportation kept you from medical appointments, meetings, work or from getting things needed for daily living? : No  Physical Activity: Not on file  Stress: Not on file  Social Connections: Not on file  Depression (EYV7-0): Not on file  Alcohol Screen: Not on file  Housing: Low Risk (12/16/2023)   Received from [facility]   Epic    What is your living situation today?: I have a steady place to live    Think about the place you live. Do you have problems with any of the following? Choose all that apply:: None/None on this list  Utilities: Low Risk (12/16/2023)   Received from [facility]   Utilities    In the past 12 months has the electric, gas, oil, or water company threatened to shut off services in your home? : No  Health  Literacy: Not on file   Allergies[1] Family History  Problem Relation Age of Onset   Hodgkin's lymphoma Mother    Colon polyps Father    Diverticulitis Father        colostomy   Skin cancer Sister    Cancer Sister        type unknown, stomach area   Skin cancer Brother    Skin cancer Brother        melanoma   Hodgkin's lymphoma Maternal Grandmother    Gallbladder disease Maternal Grandmother    Colon cancer Neg Hx    Esophageal cancer Neg Hx    Inflammatory bowel disease Neg Hx    Liver disease Neg Hx    Pancreatic cancer Neg Hx    Rectal cancer Neg Hx    Stomach cancer Neg Hx     Current Outpatient Medications (Endocrine & Metabolic):    alendronate (FOSAMAX) 70 MG tablet, Take 70 mg by mouth once a week.  Current Outpatient Medications (Cardiovascular):    cholestyramine  (QUESTRAN ) 4 g packet, Take 1 packet (4 g total) by mouth daily.  Current Outpatient Medications (Hematological):    cyanocobalamin (VITAMIN B12) 500 MCG tablet, Take 500 mcg by mouth daily.   folic acid (FOLVITE) 1 MG tablet, Take 1 mg by mouth daily.  Current Outpatient Medications (Other):    DULoxetine  (CYMBALTA ) 20 MG capsule, Take 1 capsule (20 mg total) by mouth daily.   hydrocortisone 2.5 % ointment, Apply 1 Application topically as needed.   loperamide (IMODIUM A-D) 2 MG tablet, Take 2 mg by mouth as needed.   MAGNESIUM CITRATE PO*, Take 1 tablet by mouth daily.   Multiple Vitamin (MULTIVITAMIN PO), Take 1 capsule by mouth daily.   Vitamin D , Ergocalciferol , (DRISDOL ) 1.25 MG (50000 UNIT) CAPS capsule, Take 1 capsule (50,000 Units total) by mouth every 7 (seven) days.   gabapentin  (NEURONTIN ) 100 MG capsule, Take 2 capsules (200 mg total) by mouth at bedtime. (Patient taking differently: Take 200 mg by mouth as needed.)  * These medications belong to multiple therapeutic classes and are listed under each applicable group.   Reviewed prior external information including notes and imaging from   primary care provider As well as notes that were available from care everywhere and other healthcare systems.  Past medical history, social, surgical and family history all reviewed in electronic medical record.  No pertanent information unless stated regarding to the chief  complaint.   Review of Systems:  No headache, visual changes, nausea, vomiting, diarrhea, constipation, dizziness, abdominal pain, skin rash, fevers, chills, night sweats, weight loss, swollen lymph nodes, body aches, joint swelling, chest pain, shortness of breath, mood changes. POSITIVE muscle aches  Objective  Blood pressure 104/62, pulse 72, height 5' 5.5 (1.664 m), weight 152 lb (68.9 kg), SpO2 96%.   General: No apparent distress alert and oriented x3 mood and affect normal, dressed appropriately.  HEENT: Pupils equal, extraocular movements intact  Respiratory: Patient's speak in full sentences and does not appear short of breath  Cardiovascular: No lower extremity edema, non tender, no erythema  Low back shows loss lordosis noted.  Some tenderness to palpation in the lower back but seems to be more though in the lower left knee.  Procedure: Real-time Ultrasound Guided Injection of left knee Baker's cyst Device: GE Logiq Q7 Ultrasound guided injection is preferred based studies that show increased duration, increased effect, greater accuracy, decreased procedural pain, increased response rate, and decreased cost with ultrasound guided versus blind injection.  Verbal informed consent obtained.  Time-out conducted.  Noted no overlying erythema, induration, or other signs of local infection.  Skin prepped in a sterile fashion.  Local anesthesia: Topical Ethyl chloride.  With sterile technique and under real time ultrasound guidance: With a 22-gauge 2 inch needle patient was injected with 4 cc of 0.5% Marcaine and aspirated significant amount of straw light-colored fluid then injected 1 cc of Kenalog  40 mg/dL. This  was from a posterior approach.  Completed without difficulty  Pain immediately resolved suggesting accurate placement of the medication.  Advised to call if fevers/chills, erythema, induration, drainage, or persistent bleeding.  Images permanently stored .  Impression: Technically successful ultrasound guided injection.    Impression and Recommendations:    The above documentation has been reviewed and is accurate and complete Carson Bogden M Zilah Villaflor, DO        [1] No Known Allergies  "

## 2024-01-28 ENCOUNTER — Ambulatory Visit: Payer: MEDICARE | Admitting: Family Medicine

## 2024-01-28 ENCOUNTER — Encounter: Payer: Self-pay | Admitting: Family Medicine

## 2024-01-28 ENCOUNTER — Other Ambulatory Visit: Payer: Self-pay

## 2024-01-28 VITALS — BP 104/62 | HR 72 | Ht 65.5 in | Wt 152.0 lb

## 2024-01-28 DIAGNOSIS — M25562 Pain in left knee: Secondary | ICD-10-CM

## 2024-01-28 DIAGNOSIS — M7122 Synovial cyst of popliteal space [Baker], left knee: Secondary | ICD-10-CM | POA: Diagnosis not present

## 2024-01-28 NOTE — Assessment & Plan Note (Signed)
 Patient had aspiration done and tolerated the procedure well.  Baker's cyst does seem to encapsulate some of the hamstring tendon that I think is what is contributing to patient's feeling of decreased range of motion.  Hopeful that this does make a difference.  We discussed with patient though the CLL, discussed increasing activity slowly.  The patient wants to avoid any other surgical intervention if possible but does need to feel good with patient traveling to [place] in the near future follow-up with me again in 6 weeks to make sure she is doing well.

## 2024-01-28 NOTE — Patient Instructions (Addendum)
 Sorel shoes Drained cyst Take amoxicillin 2x a day for 7 days See me before [place]

## 2024-03-17 ENCOUNTER — Ambulatory Visit: Payer: MEDICARE | Admitting: Family Medicine
